# Patient Record
Sex: Female | Born: 1995 | Race: Black or African American | Hispanic: No | Marital: Single | Smoking: Never smoker
Health system: Southern US, Community
[De-identification: ages and names within clinical notes are randomized; demographics above are authoritative.]

## PROBLEM LIST (undated history)

## (undated) ENCOUNTER — Emergency Department (HOSPITAL_COMMUNITY): Discharge status: Against Medical Advice | Payer: Medicaid Other

## (undated) DIAGNOSIS — E669 Obesity, unspecified: Secondary | ICD-10-CM

## (undated) DIAGNOSIS — E282 Polycystic ovarian syndrome: Secondary | ICD-10-CM

## (undated) DIAGNOSIS — F419 Anxiety disorder, unspecified: Secondary | ICD-10-CM

## (undated) HISTORY — PX: TONSILLECTOMY: SUR1361

## (undated) HISTORY — PX: OTHER SURGICAL HISTORY: SHX169

---
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
## 2016-02-17 ENCOUNTER — Emergency Department (HOSPITAL_COMMUNITY)
Admission: EM | Admit: 2016-02-17 | Discharge: 2016-02-17 | Disposition: A | Discharge status: Home / Self Care | Payer: Medicaid Other | Attending: Emergency Medicine | Admitting: Emergency Medicine

## 2016-02-17 ENCOUNTER — Encounter (HOSPITAL_COMMUNITY): Payer: Self-pay

## 2016-02-17 ENCOUNTER — Emergency Department (HOSPITAL_COMMUNITY): Payer: Medicaid Other

## 2016-02-17 DIAGNOSIS — M2141 Flat foot [pes planus] (acquired), right foot: Secondary | ICD-10-CM | POA: Diagnosis not present

## 2016-02-17 DIAGNOSIS — J45909 Unspecified asthma, uncomplicated: Secondary | ICD-10-CM | POA: Diagnosis not present

## 2016-02-17 DIAGNOSIS — Z791 Long term (current) use of non-steroidal anti-inflammatories (NSAID): Secondary | ICD-10-CM | POA: Diagnosis not present

## 2016-02-17 DIAGNOSIS — M25571 Pain in right ankle and joints of right foot: Secondary | ICD-10-CM | POA: Diagnosis not present

## 2016-02-17 DIAGNOSIS — Z79899 Other long term (current) drug therapy: Secondary | ICD-10-CM | POA: Insufficient documentation

## 2016-02-17 MED ORDER — NAPROXEN SODIUM 275 MG PO TABS: 275.0000 mg | ORAL_TABLET | Freq: Two times a day (BID) | ORAL | 1 refills | Status: DC

## 2016-02-17 NOTE — ED Provider Notes (Signed)
[facility]-EMERGENCY DEPT Provider Note   CSN: [id number] Arrival date & time: 02/17/16  0556     History   Chief Complaint Chief Complaint  Patient presents with  . Ankle Pain    HPI Melissa Ramirez is a 20 y.o. female 's emergency Department with chief complaint of right ankle pain. 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 Patient works at [employer] and is on her feet all day long. She is also obese. She states that over the past several weeks. She's had pain in the right ankle after standing on it for long hours. She states that by the end of the day. It is also swollen. She is frequently works back-to-back shifts and states that she is to have time to let her ankle, rest, and some days are difficult for her to even walk on the ankle., Swelling in the calf, heat or redness. She has no known injuries to the ankle.  HPI  Past Medical History:  Diagnosis Date  . Anxiety   . Asthma   . Obesity   . PCOS (polycystic ovarian syndrome)     Patient Active Problem List   Diagnosis Date Noted  . Obesity 02/21/2014  . PCOS (polycystic ovarian syndrome) 02/21/2014  . Asthma 02/21/2014  . Fibrocystic breast changes of both breasts 02/21/2014    Past Surgical History:  Procedure Laterality Date  . FOOT SURGERY  2009  . TONSILLECTOMY    . tubes in ears      OB History    Gravida Para Term Preterm AB Living   0 0 0 0 0 0   SAB TAB Ectopic Multiple Live Births   0 0 0 0         Home Medications    Prior to Admission medications   Medication Sig Start Date End Date Taking? Authorizing Provider  albuterol (PROVENTIL HFA;VENTOLIN HFA) 108 (90 BASE) MCG/ACT inhaler Inhale 1-2 puffs into the lungs every 6 (six) hours as needed for wheezing or shortness of breath. 08/13/14   Tiffany Neva Seat, PA-C  cetirizine (ZYRTEC ALLERGY) 10 MG tablet Take 1 tablet (10 mg total) by mouth daily. 11/19/15   Danelle Berry,  PA-C  ferrous sulfate 325 (65 FE) MG tablet Take 325 mg by mouth daily with breakfast.    Historical Provider, MD  fluticasone (FLONASE) 50 MCG/ACT nasal spray Place 2 sprays into both nostrils daily. 11/20/15   Danelle Berry, PA-C  Guaifenesin 1200 MG TB12 Take 1 tablet (1,200 mg total) by mouth 2 (two) times daily. Patient taking differently: Take 1 tablet by mouth 2 (two) times daily as needed (cough).  08/09/15   Charlestine Night, PA-C  hydrOXYzine (ATARAX/VISTARIL) 25 MG tablet Take 1 tablet (25 mg total) by mouth every 6 (six) hours as needed for anxiety. 11/18/15   Tilden Fossa, MD  ibuprofen (ADVIL,MOTRIN) 600 MG tablet Take 1 tablet (600 mg total) by mouth every 6 (six) hours as needed for headache. 11/17/15   Lyndal Pulley, MD  LORazepam (ATIVAN) 0.5 MG tablet Take 1 tablet (0.5 mg total) by mouth 2 (two) times daily as needed for anxiety (use sparingly, no more than two times a day, for severe episodes of anxiety and/or panic attacks). 11/20/15   Danelle Berry, PA-C  methocarbamol (ROBAXIN) 500 MG tablet Take 1 tablet (500 mg total) by mouth 2 (two) times daily. Patient not taking: Reported on 11/20/2015 07/07/15  Santiago Glad, PA-C    Family History History reviewed. No pertinent family history.  Social History Social History  Substance Use Topics  . Smoking status: Never Smoker  . Smokeless tobacco: Never Used  . Alcohol use No     Allergies   Morphine and related and Oxycodone   Review of Systems Review of Systems Ten systems reviewed and are negative for acute change, except as noted in the HPI.    Physical Exam Updated Vital Signs BP 125/86 (BP Location: Right Arm)   Pulse 79   Temp 98.1 F (36.7 C) (Oral)   Resp 18   Ht 5\' 6"  (1.676 m)   Wt 113.4 kg   LMP 01/20/2016   SpO2 100%   BMI 40.35 kg/m   Physical Exam  Constitutional: She is oriented to person, place, and time. She appears well-developed and well-nourished. No distress.  HENT:  Head:  Normocephalic and atraumatic.  Eyes: Conjunctivae are normal. No scleral icterus.  Neck: Normal range of motion.  Cardiovascular: Normal rate, regular rhythm and normal heart sounds.  Exam reveals no gallop and no friction rub.   No murmur heard. Pulmonary/Chest: Effort normal and breath sounds normal. No respiratory distress.  Abdominal: Soft. Bowel sounds are normal. She exhibits no distension and no mass. There is no tenderness. There is no guarding.  Musculoskeletal:  Bilateral pes planus, Well healed surgical scar on the dorsal aspect of the left ankle. Decreased range of motion on the left. Full strength and range of motion of the right ankle, no tenderness to palpation. No swelling, bilateral DP and PT pulses 2+, feet warm and well perfused with normal sensation  Neurological: She is alert and oriented to person, place, and time.  Skin: Skin is warm and dry. She is not diaphoretic.  Nursing note and vitals reviewed.    ED Treatments / Results  Labs (all labs ordered are listed, but only abnormal results are displayed) Labs Reviewed - No data to display  EKG  EKG Interpretation None       Radiology Dg Ankle Complete Right  Result Date: 02/17/2016 CLINICAL DATA:  Nontraumatic right ankle pain for 2 days. EXAM: RIGHT ANKLE - COMPLETE 3+ VIEW COMPARISON:  None. FINDINGS: Negative for acute fracture or dislocation. The mortise is symmetric. No bone lesion or bony destruction. No arthritic changes. Marked pes planus. IMPRESSION: Pes planus.  No acute findings are evident about the ankle. Electronically Signed   By: Ellery Plunk M.D.   On: 02/17/2016 06:46    Procedures Procedures (including critical care time)  Medications Ordered in ED Medications - No data to display   Initial Impression / Assessment and Plan / ED Course  I have reviewed the triage vital signs and the nursing notes.  Pertinent labs & imaging results that were available during my care of the  patient were reviewed by me and considered in my medical decision making (see chart for details).  Clinical Course    Patient with negative x-ray. It does shows markedly pes planus. I suspect that her ankle pain is secondary to her obesity and pes planus. Patient will get an ASO splint. However, I feel likely orthotics and follow-up with either sports medicine or podiatry will be helpful. Patient given naproxen which have advised her to take prior to shift where she will be standing for long periods of time. I also advised icing before and after work. No calf swelling or pain. She appears safe for discharge at this time. Final  Clinical Impressions(s) / ED Diagnoses   Final diagnoses:  None    New Prescriptions New Prescriptions   No medications on file     Arthor Captainbigail Ciclaly Mulcahey, Cordelia Poche-C 02/17/16 [id number]    April Palumbo, MD 02/20/16 0020

## 2016-02-17 NOTE — Discharge Instructions (Addendum)
Please return to the emergency department if you have any worsening in your swelling including worsening pain, swelling, redness or heat. Please follow up with Dr. Pearletha ForgeHudnall for further evaluation of her joint pain. He may apply ice to the ankle before and after work, which may help with decreasing swelling and pain.

## 2016-02-17 NOTE — ED Triage Notes (Signed)
Pt complains of right ankle pain since Sunday, no injury noted but its swollen and hard to stand for long periods of time.

## 2016-02-20 ENCOUNTER — Encounter (HOSPITAL_COMMUNITY): Payer: Self-pay | Admitting: Emergency Medicine

## 2016-02-20 DIAGNOSIS — Y999 Unspecified external cause status: Secondary | ICD-10-CM | POA: Insufficient documentation

## 2016-02-20 DIAGNOSIS — J45909 Unspecified asthma, uncomplicated: Secondary | ICD-10-CM | POA: Insufficient documentation

## 2016-02-20 DIAGNOSIS — Y929 Unspecified place or not applicable: Secondary | ICD-10-CM | POA: Insufficient documentation

## 2016-02-20 DIAGNOSIS — Z7951 Long term (current) use of inhaled steroids: Secondary | ICD-10-CM | POA: Diagnosis not present

## 2016-02-20 DIAGNOSIS — Y939 Activity, unspecified: Secondary | ICD-10-CM | POA: Insufficient documentation

## 2016-02-20 DIAGNOSIS — S99911A Unspecified injury of right ankle, initial encounter: Secondary | ICD-10-CM | POA: Diagnosis present

## 2016-02-20 DIAGNOSIS — X58XXXA Exposure to other specified factors, initial encounter: Secondary | ICD-10-CM | POA: Diagnosis not present

## 2016-02-20 DIAGNOSIS — Z79899 Other long term (current) drug therapy: Secondary | ICD-10-CM | POA: Insufficient documentation

## 2016-02-20 DIAGNOSIS — S93401A Sprain of unspecified ligament of right ankle, initial encounter: Secondary | ICD-10-CM | POA: Diagnosis not present

## 2016-02-20 NOTE — ED Triage Notes (Signed)
Pt was seen on 9/12 for an ankle injury.  States no fracture was diagnosed and she was placed in an ASO.  ASO in place on presentation today.  States that she feels that she has re injured the ankle.  Pain in the ankle and "tingling" in the toes.  Pain worsened after being on her feet at work.

## 2016-02-21 ENCOUNTER — Emergency Department (HOSPITAL_COMMUNITY)
Admission: EM | Admit: 2016-02-21 | Discharge: 2016-02-21 | Disposition: A | Discharge status: Home / Self Care | Payer: Medicaid Other | Attending: Emergency Medicine | Admitting: Emergency Medicine

## 2016-02-21 DIAGNOSIS — S93401A Sprain of unspecified ligament of right ankle, initial encounter: Secondary | ICD-10-CM

## 2016-02-21 MED ORDER — NAPROXEN SODIUM 275 MG PO TABS: 275.0000 mg | ORAL_TABLET | Freq: Two times a day (BID) | ORAL | 0 refills | Status: DC

## 2016-02-21 NOTE — ED Provider Notes (Signed)
[facility] Provider Note   CSN: [id number] Arrival date & time: 02/20/16  2246     History   Chief Complaint Chief Complaint  Patient presents with  . Foot Pain    HPI Melissa Ramirez is a 20 y.o. female.  20 year old female with no significant past medical history presents to the emergency department for evaluation of persistent right ankle pain. Patient was seen on 02/17/2016 for similar symptoms. She states that she has continued to have discomfort in her ankle, primarily after standing for long hours at her job. She had a negative x-ray at initial ED visit. She reports that she may have "stepped wrong". She had a fleeting episode of paresthesias in her toes which has resolved. Patient has continued wearing an ankle brace and icing her ankle. She followed up with her primary care doctor 2 days ago who is in the process of referring her to orthopedics.   The history is provided by the patient. No language interpreter was used.  Foot Pain     Past Medical History:  Diagnosis Date  . Anxiety   . Asthma   . Obesity   . PCOS (polycystic ovarian syndrome)     Patient Active Problem List   Diagnosis Date Noted  . Obesity 02/21/2014  . PCOS (polycystic ovarian syndrome) 02/21/2014  . Asthma 02/21/2014  . Fibrocystic breast changes of both breasts 02/21/2014    Past Surgical History:  Procedure Laterality Date  . FOOT SURGERY  2009  . TONSILLECTOMY    . tubes in ears      OB History    Gravida Para Term Preterm AB Living   0 0 0 0 0 0   SAB TAB Ectopic Multiple Live Births   0 0 0 0         Home Medications    Prior to Admission medications   Medication Sig Start Date End Date Taking? Authorizing Provider  albuterol (PROVENTIL HFA;VENTOLIN HFA) 108 (90 BASE) MCG/ACT inhaler Inhale 1-2 puffs into the lungs every 6 (six) hours as needed for wheezing or shortness of breath. 08/13/14   Tiffany Neva Seat, PA-C  cetirizine (ZYRTEC ALLERGY) 10 MG tablet Take 1  tablet (10 mg total) by mouth daily. 11/19/15   Danelle Berry, PA-C  ferrous sulfate 325 (65 FE) MG tablet Take 325 mg by mouth daily with breakfast.    Historical Provider, MD  fluticasone (FLONASE) 50 MCG/ACT nasal spray Place 2 sprays into both nostrils daily. 11/20/15   Danelle Berry, PA-C  Guaifenesin 1200 MG TB12 Take 1 tablet (1,200 mg total) by mouth 2 (two) times daily. Patient taking differently: Take 1 tablet by mouth 2 (two) times daily as needed (cough).  08/09/15   Charlestine Night, PA-C  hydrOXYzine (ATARAX/VISTARIL) 25 MG tablet Take 1 tablet (25 mg total) by mouth every 6 (six) hours as needed for anxiety. 11/18/15   Tilden Fossa, MD  ibuprofen (ADVIL,MOTRIN) 600 MG tablet Take 1 tablet (600 mg total) by mouth every 6 (six) hours as needed for headache. 11/17/15   Lyndal Pulley, MD  LORazepam (ATIVAN) 0.5 MG tablet Take 1 tablet (0.5 mg total) by mouth 2 (two) times daily as needed for anxiety (use sparingly, no more than two times a day, for severe episodes of anxiety and/or panic attacks). 11/20/15   Danelle Berry, PA-C  methocarbamol (ROBAXIN) 500 MG tablet Take 1 tablet (500 mg total) by mouth 2 (two) times daily. Patient not taking: Reported on 11/20/2015 07/07/15   Santiago Glad,  PA-C  naproxen sodium (ANAPROX) 275 MG tablet Take 1 tablet (275 mg total) by mouth 2 (two) times daily with a meal. As needed for ankle pain. 02/21/16   Antony Madura, PA-C    Family History No family history on file.  Social History Social History  Substance Use Topics  . Smoking status: Never Smoker  . Smokeless tobacco: Never Used  . Alcohol use No     Allergies   Morphine and related and Oxycodone   Review of Systems Review of Systems  Musculoskeletal: Positive for arthralgias and joint swelling.  Ten systems reviewed and are negative for acute change, except as noted in the HPI.     Physical Exam Updated Vital Signs BP 112/76 (BP Location: Left Arm)   Pulse 80   Temp 99.9 F (37.7 C)    Resp 19   LMP 01/20/2016   SpO2 100%   Physical Exam  Constitutional: She is oriented to person, place, and time. She appears well-developed and well-nourished. No distress.  Nontoxic appearing  HENT:  Head: Normocephalic and atraumatic.  Eyes: Conjunctivae and EOM are normal. No scleral icterus.  Neck: Normal range of motion.  Cardiovascular: Normal rate, regular rhythm and intact distal pulses.   DP and PT pulses 2+ in the right lower extremity.  Pulmonary/Chest: Effort normal. No respiratory distress.  Respirations even and unlabored  Musculoskeletal: Normal range of motion.  Mild soft tissue swelling to the right lateral ankle without effusion. No bony deformity or crepitus. Negative Thompson's test. No erythema or heat to touch.  Neurological: She is alert and oriented to person, place, and time.  Sensation to light touch intact in the right lower extremity. Patient able to wiggle all toes.  Skin: Skin is warm and dry. No rash noted. She is not diaphoretic. No erythema. No pallor.  Psychiatric: She has a normal mood and affect. Her behavior is normal.  Nursing note and vitals reviewed.    ED Treatments / Results  Labs (all labs ordered are listed, but only abnormal results are displayed) Labs Reviewed - No data to display  EKG  EKG Interpretation None       Radiology Dg Ankle Complete Right  Result Date: 02/17/2016 CLINICAL DATA:  Nontraumatic right ankle pain for 2 days. EXAM: RIGHT ANKLE - COMPLETE 3+ VIEW COMPARISON:  None. FINDINGS: Negative for acute fracture or dislocation. The mortise is symmetric. No bone lesion or bony destruction. No arthritic changes. Marked pes planus. IMPRESSION: Pes planus.  No acute findings are evident about the ankle. Electronically Signed   By: Ellery Plunk M.D.   On: 02/17/2016 06:46    Procedures Procedures (including critical care time)  Medications Ordered in ED Medications - No data to display   Initial Impression /  Assessment and Plan / ED Course  I have reviewed the triage vital signs and the nursing notes.  Pertinent labs & imaging results that were available during my care of the patient were reviewed by me and considered in my medical decision making (see chart for details).  Clinical Course    20 year old female presents to the emergency department for evaluation of persistent right ankle pain. Symptoms consistent with ankle sprain. No concern for septic joint. Will continue to manage supportively. Patient has been advised to consistently use NSAIDs. She is in the process of being referred to orthopedics by her primary care doctor. Return precautions discussed and provided. Patient discharged in satisfactory condition with no unaddressed concerns.   Final Clinical Impressions(s) /  ED Diagnoses   Final diagnoses:  Ankle sprain, right, initial encounter    New Prescriptions Discharge Medication List as of 02/21/2016  3:41 AM       Antony MaduraKelly Darlen Gledhill, PA-C 02/21/16 0404    Dione Boozeavid Glick, MD 02/21/16 [phone]

## 2016-04-02 ENCOUNTER — Emergency Department (HOSPITAL_COMMUNITY)
Admission: EM | Admit: 2016-04-02 | Discharge: 2016-04-03 | Disposition: A | Discharge status: Home / Self Care | Payer: Medicaid Other | Attending: Emergency Medicine | Admitting: Emergency Medicine

## 2016-04-02 ENCOUNTER — Encounter (HOSPITAL_COMMUNITY): Payer: Self-pay | Admitting: Emergency Medicine

## 2016-04-02 DIAGNOSIS — R110 Nausea: Secondary | ICD-10-CM | POA: Insufficient documentation

## 2016-04-02 DIAGNOSIS — R51 Headache: Secondary | ICD-10-CM | POA: Diagnosis present

## 2016-04-02 DIAGNOSIS — R200 Anesthesia of skin: Secondary | ICD-10-CM | POA: Insufficient documentation

## 2016-04-02 DIAGNOSIS — J309 Allergic rhinitis, unspecified: Secondary | ICD-10-CM | POA: Diagnosis not present

## 2016-04-02 DIAGNOSIS — Z79899 Other long term (current) drug therapy: Secondary | ICD-10-CM | POA: Insufficient documentation

## 2016-04-02 DIAGNOSIS — R519 Headache, unspecified: Secondary | ICD-10-CM

## 2016-04-02 DIAGNOSIS — R197 Diarrhea, unspecified: Secondary | ICD-10-CM | POA: Insufficient documentation

## 2016-04-02 MED ORDER — DIPHENHYDRAMINE HCL 50 MG/ML IJ SOLN
25.0000 mg | Freq: Once | INTRAMUSCULAR | Status: AC
  Administered 2016-04-02: 25 mg via INTRAVENOUS
  Filled 2016-04-02: qty 1

## 2016-04-02 MED ORDER — SODIUM CHLORIDE 0.9 % IV BOLUS (SEPSIS)
1000.0000 mL | Freq: Once | INTRAVENOUS | Status: AC
  Administered 2016-04-02: 1000 mL via INTRAVENOUS

## 2016-04-02 MED ORDER — ACETAMINOPHEN 325 MG PO TABS
650.0000 mg | ORAL_TABLET | Freq: Once | ORAL | Status: AC
  Administered 2016-04-02: 650 mg via ORAL
  Filled 2016-04-02: qty 2

## 2016-04-02 MED ORDER — METOCLOPRAMIDE HCL 5 MG/ML IJ SOLN
10.0000 mg | Freq: Once | INTRAMUSCULAR | Status: AC
  Administered 2016-04-02: 10 mg via INTRAVENOUS
  Filled 2016-04-02: qty 2

## 2016-04-02 NOTE — ED Provider Notes (Signed)
[facility]ER[facility] DEPT Provider Note   CSN: [id number] Arrival date & time: 04/02/16  1631  By signing my name below, I, Octavia Heirrianna Nassar, attest that this documentation has been prepared under the direction and in the presence of Everlene FarrierWilliam Weda Baumgarner, PA-C.  Electronically Signed: Octavia HeirArianna Nassar, ED Scribe. 04/02/16. 10:03 PM.    History   Chief Complaint Chief Complaint  Patient presents with  . Headache    The history is provided by the patient. No language interpreter was used.   HPI Comments: Melissa Ramirez is a 20 y.o. female who has a PMhx of asthma, allergies, PCOS, and anxiety presents to the Emergency Department complaining of a constant, gradual worsening, frontal headache x 4 days. She rates her headache as a 6/10. Associated nausea, diarrhea, left ear fullness, and tingling to the bilateral sides of her face. She notes having a hx of headaches in the past but states none have lasted this long. Pt has taken Aleve to alleviate her headache without relief. She has been evaluated by her PCP for her symptoms but has not had any diagnosis. Denies sinus congestion, post-nasal drip, neck pain, neck stiffness, fever, shortness of breath, vomiting, abdominal pain, double vision, trouble swallowing, ear pain, ear discharge, dysuria, hematuria, abnormal vaginal discharge, numbness, weakness or tingling to upper or lower extremities. She is currently on her menstrual cycle.  Past Medical History:  Diagnosis Date  . Anxiety   . Asthma   . Obesity   . PCOS (polycystic ovarian syndrome)     Patient Active Problem List   Diagnosis Date Noted  . Obesity 02/21/2014  . PCOS (polycystic ovarian syndrome) 02/21/2014  . Asthma 02/21/2014  . Fibrocystic breast changes of both breasts 02/21/2014    Past Surgical History:  Procedure Laterality Date  . FOOT SURGERY  2009  . TONSILLECTOMY    . tubes in ears      OB History    Gravida Para Term Preterm AB Living   0 0 0 0 0 0   SAB TAB Ectopic  Multiple Live Births   0 0 0 0         Home Medications    Prior to Admission medications   Medication Sig Start Date End Date Taking? Authorizing Provider  albuterol (PROVENTIL HFA;VENTOLIN HFA) 108 (90 BASE) MCG/ACT inhaler Inhale 1-2 puffs into the lungs every 6 (six) hours as needed for wheezing or shortness of breath. 08/13/14  Yes Tiffany Neva SeatGreene, PA-C  hydrOXYzine (ATARAX/VISTARIL) 25 MG tablet Take 1 tablet (25 mg total) by mouth every 6 (six) hours as needed for anxiety. 11/18/15  Yes Tilden FossaElizabeth Rees, MD  LORazepam (ATIVAN) 0.5 MG tablet Take 1 tablet (0.5 mg total) by mouth 2 (two) times daily as needed for anxiety (use sparingly, no more than two times a day, for severe episodes of anxiety and/or panic attacks). 11/20/15  Yes Danelle BerryLeisa Tapia, PA-C  acetaminophen (TYLENOL) 500 MG tablet Take 1 tablet (500 mg total) by mouth every 6 (six) hours as needed. 04/03/16   Everlene FarrierWilliam Roseline Ebarb, PA-C  cetirizine (ZYRTEC ALLERGY) 10 MG tablet Take 1 tablet (10 mg total) by mouth daily. 04/03/16   Everlene FarrierWilliam Stephany Poorman, PA-C  fluticasone (FLONASE) 50 MCG/ACT nasal spray Place 1 spray into both nostrils daily. 04/03/16   Everlene FarrierWilliam Jesper Stirewalt, PA-C  methocarbamol (ROBAXIN) 500 MG tablet Take 1 tablet (500 mg total) by mouth 2 (two) times daily. Patient not taking: Reported on 11/20/2015 07/07/15   Santiago GladHeather Laisure, PA-C    Family History History reviewed. No  pertinent family history.  Social History Social History  Substance Use Topics  . Smoking status: Never Smoker  . Smokeless tobacco: Never Used  . Alcohol use No     Allergies   Morphine and related and Oxycodone   Review of Systems Review of Systems  Constitutional: Negative for fever.  HENT: Positive for ear pain (left ear fullness). Negative for postnasal drip, sinus pressure and trouble swallowing.   Eyes: Negative for visual disturbance.  Respiratory: Negative for cough and shortness of breath.   Cardiovascular: Negative for chest pain.    Gastrointestinal: Positive for diarrhea and nausea. Negative for abdominal pain and vomiting.  Genitourinary: Negative for dysuria, hematuria and vaginal discharge.  Musculoskeletal: Negative for neck pain and neck stiffness.  Neurological: Positive for numbness and headaches. Negative for dizziness, syncope, weakness and light-headedness.     Physical Exam Updated Vital Signs BP 123/73 (BP Location: Right Arm)   Pulse 65   Temp 98.5 F (36.9 C) (Oral)   Resp 20   Ht 5\' 6"  (1.676 m)   Wt 112.9 kg   SpO2 100%   BMI 40.19 kg/m   Physical Exam  Constitutional: She is oriented to person, place, and time. She appears well-developed and well-nourished. No distress.  HENT:  Head: Normocephalic and atraumatic.  Right Ear: External ear normal. Tympanic membrane is not erythematous. A middle ear effusion is present.  Left Ear: External ear normal. Tympanic membrane is not erythematous. A middle ear effusion is present.  Mouth/Throat: Oropharynx is clear and moist.  Mild bilateral middle ear effusion  No TM erythema or loss of landmarks No temporal edema or tenderness Throat is clear  Eyes: Conjunctivae and EOM are normal. Pupils are equal, round, and reactive to light. Right eye exhibits no discharge. Left eye exhibits no discharge.  Neck: Normal range of motion. Neck supple. No JVD present. No tracheal deviation present.  Cardiovascular: Normal rate, regular rhythm, normal heart sounds and intact distal pulses.  Exam reveals no gallop and no friction rub.   No murmur heard. Pulmonary/Chest: Effort normal and breath sounds normal. No stridor. No respiratory distress. She has no wheezes. She has no rales.  Lungs clear to ausculation  Abdominal: Soft. There is no tenderness.  Musculoskeletal: Normal range of motion. She exhibits no edema or tenderness.  Lymphadenopathy:    She has no cervical adenopathy.  Neurological: She is alert and oriented to person, place, and time. No cranial  nerve deficit. Coordination normal.  Alerted and oriented x 3, cranial nerves intact, speech is clear and coherent, no pronator drift, finger to nose intact, sensation intact to bilateral upper and lower extremities and face, normal gait.  Skin: Skin is warm and dry. No rash noted. She is not diaphoretic. No erythema. No pallor.  Psychiatric: She has a normal mood and affect. Her behavior is normal.  Nursing note and vitals reviewed.    ED Treatments / Results  DIAGNOSTIC STUDIES: Oxygen Saturation is 98% on RA, normal by my interpretation.  COORDINATION OF CARE:  10:01 PM Will order migraine cocktail and pt will receive IV fluids. Discussed treatment plan with pt at bedside and pt agreed to plan.  Labs (all labs ordered are listed, but only abnormal results are displayed) Labs Reviewed - No data to display  EKG  EKG Interpretation None       Radiology No results found.  Procedures Procedures (including critical care time)  Medications Ordered in ED Medications  sodium chloride 0.9 % bolus 1,000  mL (1,000 mLs Intravenous New Bag/Given 04/02/16 2356)  metoCLOPramide (REGLAN) injection 10 mg (10 mg Intravenous Given 04/02/16 2343)  diphenhydrAMINE (BENADRYL) injection 25 mg (25 mg Intravenous Given 04/02/16 2357)  acetaminophen (TYLENOL) tablet 650 mg (650 mg Oral Given 04/02/16 2342)     Initial Impression / Assessment and Plan / ED Course  I have reviewed the triage vital signs and the nursing notes.  Pertinent labs & imaging results that were available during my care of the patient were reviewed by me and considered in my medical decision making (see chart for details).  Clinical Course   Pt HA treated and resolved while in ED.  Presentation is like pts typical HA and non concerning for [facility], ICH, Meningitis, or temporal arteritis. Pt is afebrile with no focal neuro deficits, nuchal rigidity, or change in vision. Pt is to follow up with PCP to discuss prophylactic  medication. I advised the patient to follow-up with their primary care provider this week. I advised the patient to return to the emergency department with new or worsening symptoms or new concerns. The patient verbalized understanding and agreement with plan.     Final Clinical Impressions(s) / ED Diagnoses   Final diagnoses:  Bad headache  Allergic rhinitis, unspecified chronicity, unspecified seasonality, unspecified trigger   I personally performed the services described in this documentation, which was scribed in my presence. The recorded information has been reviewed and is accurate.    New Prescriptions Current Discharge Medication List    START taking these medications   Details  acetaminophen (TYLENOL) 500 MG tablet Take 1 tablet (500 mg total) by mouth every 6 (six) hours as needed. Qty: 30 tablet, Refills: 0         Everlene Farrier, PA-C 04/03/16 0140    Rolland Porter, MD 04/12/16 2045

## 2016-04-02 NOTE — ED Triage Notes (Signed)
Patient reports headache x 3 days.  Reports nausea.  Denies vomiting, photophobia, dizziness.

## 2016-04-03 MED ORDER — FLUTICASONE PROPIONATE 50 MCG/ACT NA SUSP: 1.0000 | Freq: Every day | NASAL | 0 refills | Status: DC

## 2016-04-03 MED ORDER — CETIRIZINE HCL 10 MG PO TABS: 10.0000 mg | ORAL_TABLET | Freq: Every day | ORAL | 1 refills | Status: DC

## 2016-04-03 MED ORDER — ACETAMINOPHEN 500 MG PO TABS: 500.0000 mg | ORAL_TABLET | Freq: Four times a day (QID) | ORAL | 0 refills | Status: DC | PRN

## 2016-09-29 ENCOUNTER — Emergency Department (HOSPITAL_COMMUNITY): Payer: No Typology Code available for payment source

## 2016-09-29 ENCOUNTER — Emergency Department (HOSPITAL_COMMUNITY)
Admission: EM | Admit: 2016-09-29 | Discharge: 2016-09-29 | Disposition: A | Discharge status: Home / Self Care | Payer: No Typology Code available for payment source | Attending: Physician Assistant | Admitting: Physician Assistant

## 2016-09-29 ENCOUNTER — Encounter (HOSPITAL_COMMUNITY): Payer: Self-pay | Admitting: Emergency Medicine

## 2016-09-29 DIAGNOSIS — S93491A Sprain of other ligament of right ankle, initial encounter: Secondary | ICD-10-CM

## 2016-09-29 DIAGNOSIS — X509XXA Other and unspecified overexertion or strenuous movements or postures, initial encounter: Secondary | ICD-10-CM | POA: Insufficient documentation

## 2016-09-29 DIAGNOSIS — Y9301 Activity, walking, marching and hiking: Secondary | ICD-10-CM | POA: Insufficient documentation

## 2016-09-29 DIAGNOSIS — S93432A Sprain of tibiofibular ligament of left ankle, initial encounter: Secondary | ICD-10-CM | POA: Insufficient documentation

## 2016-09-29 DIAGNOSIS — Y9289 Other specified places as the place of occurrence of the external cause: Secondary | ICD-10-CM | POA: Insufficient documentation

## 2016-09-29 DIAGNOSIS — Y990 Civilian activity done for income or pay: Secondary | ICD-10-CM | POA: Insufficient documentation

## 2016-09-29 DIAGNOSIS — Z79899 Other long term (current) drug therapy: Secondary | ICD-10-CM | POA: Insufficient documentation

## 2016-09-29 DIAGNOSIS — J45909 Unspecified asthma, uncomplicated: Secondary | ICD-10-CM | POA: Insufficient documentation

## 2016-09-29 NOTE — ED Provider Notes (Signed)
[facility]-EMERGENCY DEPT Provider Note   CSN: [id number] Arrival date & time: 09/29/16  1807   By signing my name below, I, Clarisse Gouge, attest that this documentation has been prepared under the direction and in the presence of [facility], PA-C. Electronically signed, Clarisse Gouge, ED Scribe. 09/29/16. 7:51 PM.   History   Chief Complaint Chief Complaint  Patient presents with  . Ankle Pain   The history is provided by the patient, medical records and a parent. No language interpreter was used.    Melissa Ramirez is a 21 y.o. female who presents to the Emergency Department with concern for worsened lateral ankle pain s/p rolling it yesterday. Pt states she twisted her ankle walking to the restroom. Associated R ankle swelling, R ankle instability sensation and walking with a limp. She describes 7/10, constant, throbbing R ankle pain that is worse with standing and walking. Swelling improved with ice, but pain no better. No other modifying factors noted. No numbness or tingling. No other complaints at this time.     Past Medical History:  Diagnosis Date  . Anxiety   . Asthma   . Obesity   . PCOS (polycystic ovarian syndrome)     Patient Active Problem List   Diagnosis Date Noted  . Obesity 02/21/2014  . PCOS (polycystic ovarian syndrome) 02/21/2014  . Asthma 02/21/2014  . Fibrocystic breast changes of both breasts 02/21/2014    Past Surgical History:  Procedure Laterality Date  . FOOT SURGERY  2009  . TONSILLECTOMY    . tubes in ears      OB History    Gravida Para Term Preterm AB Living   0 0 0 0 0 0   SAB TAB Ectopic Multiple Live Births   0 0 0 0         Home Medications    Prior to Admission medications   Medication Sig Start Date End Date Taking? Authorizing Provider  acetaminophen (TYLENOL) 500 MG tablet Take 1 tablet (500 mg total) by mouth every 6 (six) hours as needed. 04/03/16   Everlene Farrier, PA-C  albuterol (PROVENTIL HFA;VENTOLIN HFA) 108 (90 BASE)  MCG/ACT inhaler Inhale 1-2 puffs into the lungs every 6 (six) hours as needed for wheezing or shortness of breath. 08/13/14   Tiffany Neva Seat, PA-C  cetirizine (ZYRTEC ALLERGY) 10 MG tablet Take 1 tablet (10 mg total) by mouth daily. 04/03/16   Everlene Farrier, PA-C  fluticasone (FLONASE) 50 MCG/ACT nasal spray Place 1 spray into both nostrils daily. 04/03/16   Everlene Farrier, PA-C  hydrOXYzine (ATARAX/VISTARIL) 25 MG tablet Take 1 tablet (25 mg total) by mouth every 6 (six) hours as needed for anxiety. 11/18/15   Tilden Fossa, MD  LORazepam (ATIVAN) 0.5 MG tablet Take 1 tablet (0.5 mg total) by mouth 2 (two) times daily as needed for anxiety (use sparingly, no more than two times a day, for severe episodes of anxiety and/or panic attacks). 11/20/15   Danelle Berry, PA-C  methocarbamol (ROBAXIN) 500 MG tablet Take 1 tablet (500 mg total) by mouth 2 (two) times daily. Patient not taking: Reported on 11/20/2015 07/07/15   Santiago Glad, PA-C    Family History No family history on file.  Social History Social History  Substance Use Topics  . Smoking status: Never Smoker  . Smokeless tobacco: Never Used  . Alcohol use No     Allergies   Morphine and related and Oxycodone   Review of Systems Review of Systems  Musculoskeletal: Positive for  arthralgias, gait problem and joint swelling.  Skin: Negative for wound.  Neurological: Negative for numbness.     Physical Exam Updated Vital Signs BP (!) 155/101 (BP Location: Left Wrist)   Pulse 90   Temp 98.3 F (36.8 C) (Oral)   Resp 17   Ht  (1.676 m)   Wt 278 lb (126.1 kg)   LMP 09/30/2015   SpO2 100%   BMI 44.87 kg/m   Physical Exam  Constitutional: She is oriented to person, place, and time. She appears well-developed and well-nourished. No distress.  HENT:  Head: Normocephalic and atraumatic.  Cardiovascular: Normal rate, regular rhythm and normal heart sounds.   No murmur heard. Pulmonary/Chest: Effort normal and breath  sounds normal. No respiratory distress.  Abdominal: Soft. She exhibits no distension. There is no tenderness.  Musculoskeletal: Normal range of motion. She exhibits tenderness.  TTP along R ATFL with swelling noted, FROM of the ankle, achillles intact, sensation intact, 2+ D/P  Neurological: She is alert and oriented to person, place, and time.  Skin: Skin is warm and dry.  Nursing note and vitals reviewed.    ED Treatments / Results  DIAGNOSTIC STUDIES: Oxygen Saturation is 100% on RA, NL by my interpretation.    COORDINATION OF CARE: 7:46 PM-Discussed next steps with pt. Pt verbalized understanding and is agreeable with the plan. Will order brace and crutches. Pt prepared for d/c, advised of symptomatic care at home and return precautions.    Labs (all labs ordered are listed, but only abnormal results are displayed) Labs Reviewed - No data to display  EKG  EKG Interpretation None       Radiology Dg Ankle Complete Right  Result Date: 09/29/2016 CLINICAL DATA:  Right ankle pain and swelling. EXAM: RIGHT ANKLE - COMPLETE 3+ VIEW COMPARISON:  02/17/2016 FINDINGS: No acute bony abnormality. Specifically, no fracture, subluxation, or dislocation. Soft tissues are intact. IMPRESSION: No acute bony abnormality. Electronically Signed   By: Charlett Nose M.D.   On: 09/29/2016 19:13    Procedures Procedures (including critical care time)  Medications Ordered in ED Medications - No data to display   Initial Impression / Assessment and Plan / ED Course  I have reviewed the triage vital signs and the nursing notes.  Pertinent labs & imaging results that were available during my care of the patient were reviewed by me and considered in my medical decision making (see chart for details).    Melissa Ramirez is a 21 y.o. female who presents to ED for acute onset of right ankle pain after inversion injury yesterday. RLE NVI and x-ray negative for acute injury. Crutches and ASO brace  provided in ED. Symptomatic home care instructions discussed with patient and mother at bedside. PCP follow up in 1 week if symptoms not improving. All questions answered.    Final Clinical Impressions(s) / ED Diagnoses   Final diagnoses:  Sprain of anterior talofibular ligament of right ankle, initial encounter    New Prescriptions New Prescriptions   No medications on file   I personally performed the services described in this documentation, which was scribed in my presence. The recorded information has been reviewed and is accurate.    [facility] Lucile Didonato, PA-C 09/29/16 2052    Courteney Randall An, MD 09/30/16 [phone]

## 2016-09-29 NOTE — ED Triage Notes (Signed)
Patient c/o right ankle pain and swelling onset of yesterday after work. Pt states is it common for her ankles to swell at the end of the day from work but it is unusual for this much pain. Pt iced at home with no relief.

## 2016-09-29 NOTE — ED Notes (Signed)
Patient is resting comfortably. 

## 2016-09-29 NOTE — Discharge Instructions (Signed)
Aleve as needed for pain. You can take two twice a day. Ice and alleviate for additional pain relief. If your symptoms persist without improvement in one week, please follow up with your primary care doctor.    TREATMENT  Rest, ice, elevation, and compression are the basic modes of treatment.    HOME CARE INSTRUCTIONS  Apply ice to the sore area for 15 to 20 minutes, 3 to 4 times per day. Do this while you are awake for the first 2 days. This can be stopped when the swelling goes away. Put the ice in a plastic bag and place a towel between the bag of ice and your skin.  Keep your leg elevated when possible to lessen swelling.  You may take off your ankle stabilizer at night and to take a shower or bath. Wiggle your toes several times per day if you are able.  ACTIVITY:            - Weight bearing as tolerated - if you can do it, do it. If it hurts, stop.             - Exercises should be limited to pain free range of motion            - Can start mobilization by tracing the alphabet with your foot in the air.       SEEK MEDICAL CARE IF:  You have an increase in bruising, swelling, or pain.  Your toes feel cold.  Pain relief is not achieved with medications.  EMERGENCY:: Your toes are numb or blue or you have severe pain.   MAKE SURE YOU:  Understand these instructions.  Will watch your condition.  Will get help right away if you are not doing well or get worse

## 2016-11-22 ENCOUNTER — Encounter (HOSPITAL_COMMUNITY): Payer: Self-pay | Admitting: Emergency Medicine

## 2016-11-22 ENCOUNTER — Emergency Department (HOSPITAL_COMMUNITY)
Admission: EM | Admit: 2016-11-22 | Discharge: 2016-11-22 | Disposition: A | Discharge status: Home / Self Care | Payer: No Typology Code available for payment source | Attending: Emergency Medicine | Admitting: Emergency Medicine

## 2016-11-22 DIAGNOSIS — Z79899 Other long term (current) drug therapy: Secondary | ICD-10-CM | POA: Insufficient documentation

## 2016-11-22 DIAGNOSIS — R1010 Upper abdominal pain, unspecified: Secondary | ICD-10-CM | POA: Insufficient documentation

## 2016-11-22 DIAGNOSIS — J45909 Unspecified asthma, uncomplicated: Secondary | ICD-10-CM | POA: Insufficient documentation

## 2016-11-22 DIAGNOSIS — R197 Diarrhea, unspecified: Secondary | ICD-10-CM

## 2016-11-22 LAB — COMPREHENSIVE METABOLIC PANEL
ALT: 18 U/L (ref 14–54)
AST: 16 U/L (ref 15–41)
Albumin: 4.3 g/dL (ref 3.5–5.0)
Alkaline Phosphatase: 80 U/L (ref 38–126)
Anion gap: 5 (ref 5–15)
BUN: 10 mg/dL (ref 6–20)
CHLORIDE: 107 mmol/L (ref 101–111)
CO2: 30 mmol/L (ref 22–32)
CREATININE: 0.83 mg/dL (ref 0.44–1.00)
Calcium: 9.8 mg/dL (ref 8.9–10.3)
GFR calc non Af Amer: >60 mL/min (ref >60)
Glucose, Bld: 83 mg/dL (ref 65–99)
Potassium: 3.6 mmol/L (ref 3.5–5.1)
SODIUM: 142 mmol/L (ref 135–145)
Total Bilirubin: 0.5 mg/dL (ref 0.3–1.2)
Total Protein: 8.2 g/dL — ABNORMAL HIGH (ref 6.5–8.1)

## 2016-11-22 LAB — MAGNESIUM: Magnesium: 1.9 mg/dL (ref 1.7–2.4)

## 2016-11-22 LAB — CBC
HCT: 38.7 % (ref 36.0–46.0)
Hemoglobin: 13 g/dL (ref 12.0–15.0)
MCH: 24.8 pg — AB (ref 26.0–34.0)
MCHC: 33.6 g/dL (ref 30.0–36.0)
MCV: 73.7 fL — AB (ref 78.0–100.0)
Platelets: 304 10*3/uL (ref 150–400)
RBC: 5.25 MIL/uL — AB (ref 3.87–5.11)
RDW: 14.8 % (ref 11.5–15.5)
WBC: 11 10*3/uL — ABNORMAL HIGH (ref 4.0–10.5)

## 2016-11-22 LAB — URINALYSIS, ROUTINE W REFLEX MICROSCOPIC
BILIRUBIN URINE: NEGATIVE
GLUCOSE, UA: NEGATIVE mg/dL
Hgb urine dipstick: NEGATIVE
KETONES UR: NEGATIVE mg/dL
Leukocytes, UA: NEGATIVE
Nitrite: NEGATIVE
PH: 6.5 (ref 5.0–8.0)
Protein, ur: NEGATIVE mg/dL
Specific Gravity, Urine: 1.025 (ref 1.005–1.030)

## 2016-11-22 LAB — POC URINE PREG, ED: Preg Test, Ur: NEGATIVE

## 2016-11-22 NOTE — Discharge Instructions (Signed)
You might have mild diarrhea. Most diarrheas are self limiting. If you get bloody stools or high fevers - come back to the ER. If you get severely dehydrated - come back to the ER. Otherwise, see your doctor in 2 weeks if the symptoms continue.

## 2016-11-22 NOTE — ED Triage Notes (Signed)
Pt states she has had abd pain and diarrhea for the past 2 weeks  Pt states she has used OTC medication like pepto and anti diarrhea meds without improvement  Denies vomiting  Pt states has been drinking gatorade and other liquids to stay hydrated

## 2016-11-22 NOTE — ED Provider Notes (Signed)
[facility]-EMERGENCY DEPT Provider Note   CSN: [id number] Arrival date & time: 11/22/16  0001  By signing my name below, I, Ny'Kea Lewis, attest that this documentation has been prepared under the direction and in the presence of Derwood Kaplan, MD. Electronically Signed: Karren Cobble, ED Scribe. 11/22/16. 2:13 AM  History   Chief Complaint Chief Complaint  Patient presents with  . Diarrhea  . Abdominal Pain   The history is provided by the patient and a parent. No language interpreter was used.    HPI Comments: Melissa Ramirez is a 21 y.o. female with a PMHx of anxiety, asthma, obesity, and PCOS who presents to the Emergency Department complaining of  Intermittent  upper quadrant abdominal pain and diarrhea that began eight days ago. She has no associated nausea or emesis.  Pt reports after eating cheese sticks eight days ago, she  experienced her first episode of watery diarrhea accompanied by upper quadrant abdominal pain. She reports having three episodes of diarrhea a day. Initally her diarrhea was brown and at this time she notes it is "goldish". Pt states after consuming food she has more frequent episodes of diarrhea.  She reports normal food and fluid intake. Notes family history of IBS. She has tried Pepto-Bismol and Imodium with mild relief but reports her symptoms always return. Denies hematochezia, fever, emesis, lightheadedness, or dizziness.  Pt denies any recent travels. She denies any recent antibiotics.  Past Medical History:  Diagnosis Date  . Anxiety   . Asthma   . Obesity   . PCOS (polycystic ovarian syndrome)     Patient Active Problem List   Diagnosis Date Noted  . Obesity 02/21/2014  . PCOS (polycystic ovarian syndrome) 02/21/2014  . Asthma 02/21/2014  . Fibrocystic breast changes of both breasts 02/21/2014    Past Surgical History:  Procedure Laterality Date  . FOOT SURGERY  2009  . TONSILLECTOMY    . tubes in ears      OB History    Gravida Para Term  Preterm AB Living   0 0 0 0 0 0   SAB TAB Ectopic Multiple Live Births   0 0 0 0         Home Medications    Prior to Admission medications   Medication Sig Start Date End Date Taking? Authorizing Provider  bismuth subsalicylate (PEPTO BISMOL) 262 MG/15ML suspension Take 30 mLs by mouth every 6 (six) hours as needed for diarrhea or loose stools.   Yes [provider]  loperamide (IMODIUM A-D) 2 MG tablet Take 4 mg by mouth 4 (four) times daily as needed for diarrhea or loose stools.   Yes [provider]  albuterol (PROVENTIL HFA;VENTOLIN HFA) 108 (90 BASE) MCG/ACT inhaler Inhale 1-2 puffs into the lungs every 6 (six) hours as needed for wheezing or shortness of breath. Patient not taking: Reported on 11/22/2016 08/13/14   Marlon Pel, PA-C  cetirizine (ZYRTEC ALLERGY) 10 MG tablet Take 1 tablet (10 mg total) by mouth daily. Patient not taking: Reported on 11/22/2016 04/03/16   Everlene Farrier, PA-C  fluticasone [facility]) 50 MCG/ACT nasal spray Place 1 spray into both nostrils daily. Patient not taking: Reported on 11/22/2016 04/03/16   Everlene Farrier, PA-C  hydrOXYzine (ATARAX/VISTARIL) 25 MG tablet Take 1 tablet (25 mg total) by mouth every 6 (six) hours as needed for anxiety. Patient not taking: Reported on 11/22/2016 11/18/15   Tilden Fossa, MD  LORazepam (ATIVAN) 0.5 MG tablet Take 1 tablet (0.5 mg total) by mouth  2 (two) times daily as needed for anxiety (use sparingly, no more than two times a day, for severe episodes of anxiety and/or panic attacks). Patient not taking: Reported on 11/22/2016 11/20/15   Danelle Berry, PA-C  methocarbamol (ROBAXIN) 500 MG tablet Take 1 tablet (500 mg total) by mouth 2 (two) times daily. Patient not taking: Reported on 11/20/2015 07/07/15   Santiago Glad, PA-C    Family History Family History  Problem Relation Age of Onset  . Hypertension Other   . Diabetes Other     Social History Social History  Substance Use Topics  .  Smoking status: Never Smoker  . Smokeless tobacco: Never Used  . Alcohol use No   Allergies   Morphine and related and Oxycodone  Review of Systems Review of Systems  Constitutional: Negative for fever.  Gastrointestinal: Positive for abdominal pain and nausea. Negative for blood in stool and vomiting.  Neurological: Negative for dizziness and light-headedness.  All other systems reviewed and are negative.  Physical Exam Updated Vital Signs BP (!) 143/88 (BP Location: Left Arm)   Pulse 85   Temp 98.6 F (37 C) (Oral)   Resp 20   Ht 5\' 6"  (1.676 m)   Wt 127 kg (280 lb)   SpO2 98%   BMI 45.19 kg/m   Physical Exam  Constitutional: She is oriented to person, place, and time. She appears well-developed and well-nourished.  HENT:  Head: Normocephalic.  Eyes: EOM are normal.  Sclera is clear.  Neck: Normal range of motion.  Cardiovascular: Normal rate and regular rhythm.   2+ radial pulses bilaterally.  Pulmonary/Chest: Effort normal.  Abdominal: Soft. She exhibits no distension. There is no tenderness.  No focal tenderness.  Musculoskeletal: Normal range of motion.  Neurological: She is alert and oriented to person, place, and time.  Skin: Capillary refill takes 2 to 3 seconds.  Psychiatric: She has a normal mood and affect.  Nursing note and vitals reviewed.  ED Treatments / Results  DIAGNOSTIC STUDIES: Oxygen Saturation is 98% on RA, normal by my interpretation.   COORDINATION OF CARE: 1:36 AM-Discussed next steps with pt. Pt verbalized understanding and is agreeable with the plan.   Labs (all labs ordered are listed, but only abnormal results are displayed) Labs Reviewed  COMPREHENSIVE METABOLIC PANEL - Abnormal; Notable for the following:       Result Value   Total Protein 8.2 (*)    All other components within normal limits  CBC - Abnormal; Notable for the following:    WBC 11.0 (*)    RBC 5.25 (*)    MCV 73.7 (*)    MCH 24.8 (*)    All other  components within normal limits  URINALYSIS, ROUTINE W REFLEX MICROSCOPIC  MAGNESIUM  POC URINE PREG, ED    EKG  EKG Interpretation None       Radiology No results found.  Procedures Procedures (including critical care time)  Medications Ordered in ED Medications - No data to display  Initial Impression / Assessment and Plan / ED Course  I have reviewed the triage vital signs and the nursing notes.  Pertinent labs & imaging results that were available during my care of the patient were reviewed by me and considered in my medical decision making (see chart for details).     Pt comes in with loose BM. She is having 3 episodes / day for the last 8 days. I am not sure if she is having true diarrhea. Pt has  intermittent abd cramping, mostly in the upper quadrants. Currently, pt has no abd tenderness, specifically in the epigastric region or RUQ. Pt doesn't appear profoundly dehydrated either. Labs were ordered from triage and are reassuring. There is family hx of IBS, and this could be early presentation of IBS.  Strict ER return precautions have been discussed, and patient is agreeing with the plan and is comfortable with the workup done and the recommendations from the ER.    Final Clinical Impressions(s) / ED Diagnoses   Final diagnoses:  Diarrhea, unspecified type   New Prescriptions New Prescriptions   No medications on file   I personally performed the services described in this documentation, which was scribed in my presence. The recorded information has been reviewed and is accurate.     Derwood KaplanNanavati, Tauheed Mcfayden, MD 11/22/16 [phone]

## 2017-06-12 IMAGING — CR DG CHEST 2V
2 series · 2 of 2 positions shown · non-contrast
Comparison: Chest radiograph from 08/13/2014

CLINICAL DATA: Acute onset of wheezing and productive cough.
Initial encounter.

EXAM:
CHEST  2 VIEW

[chest pa]
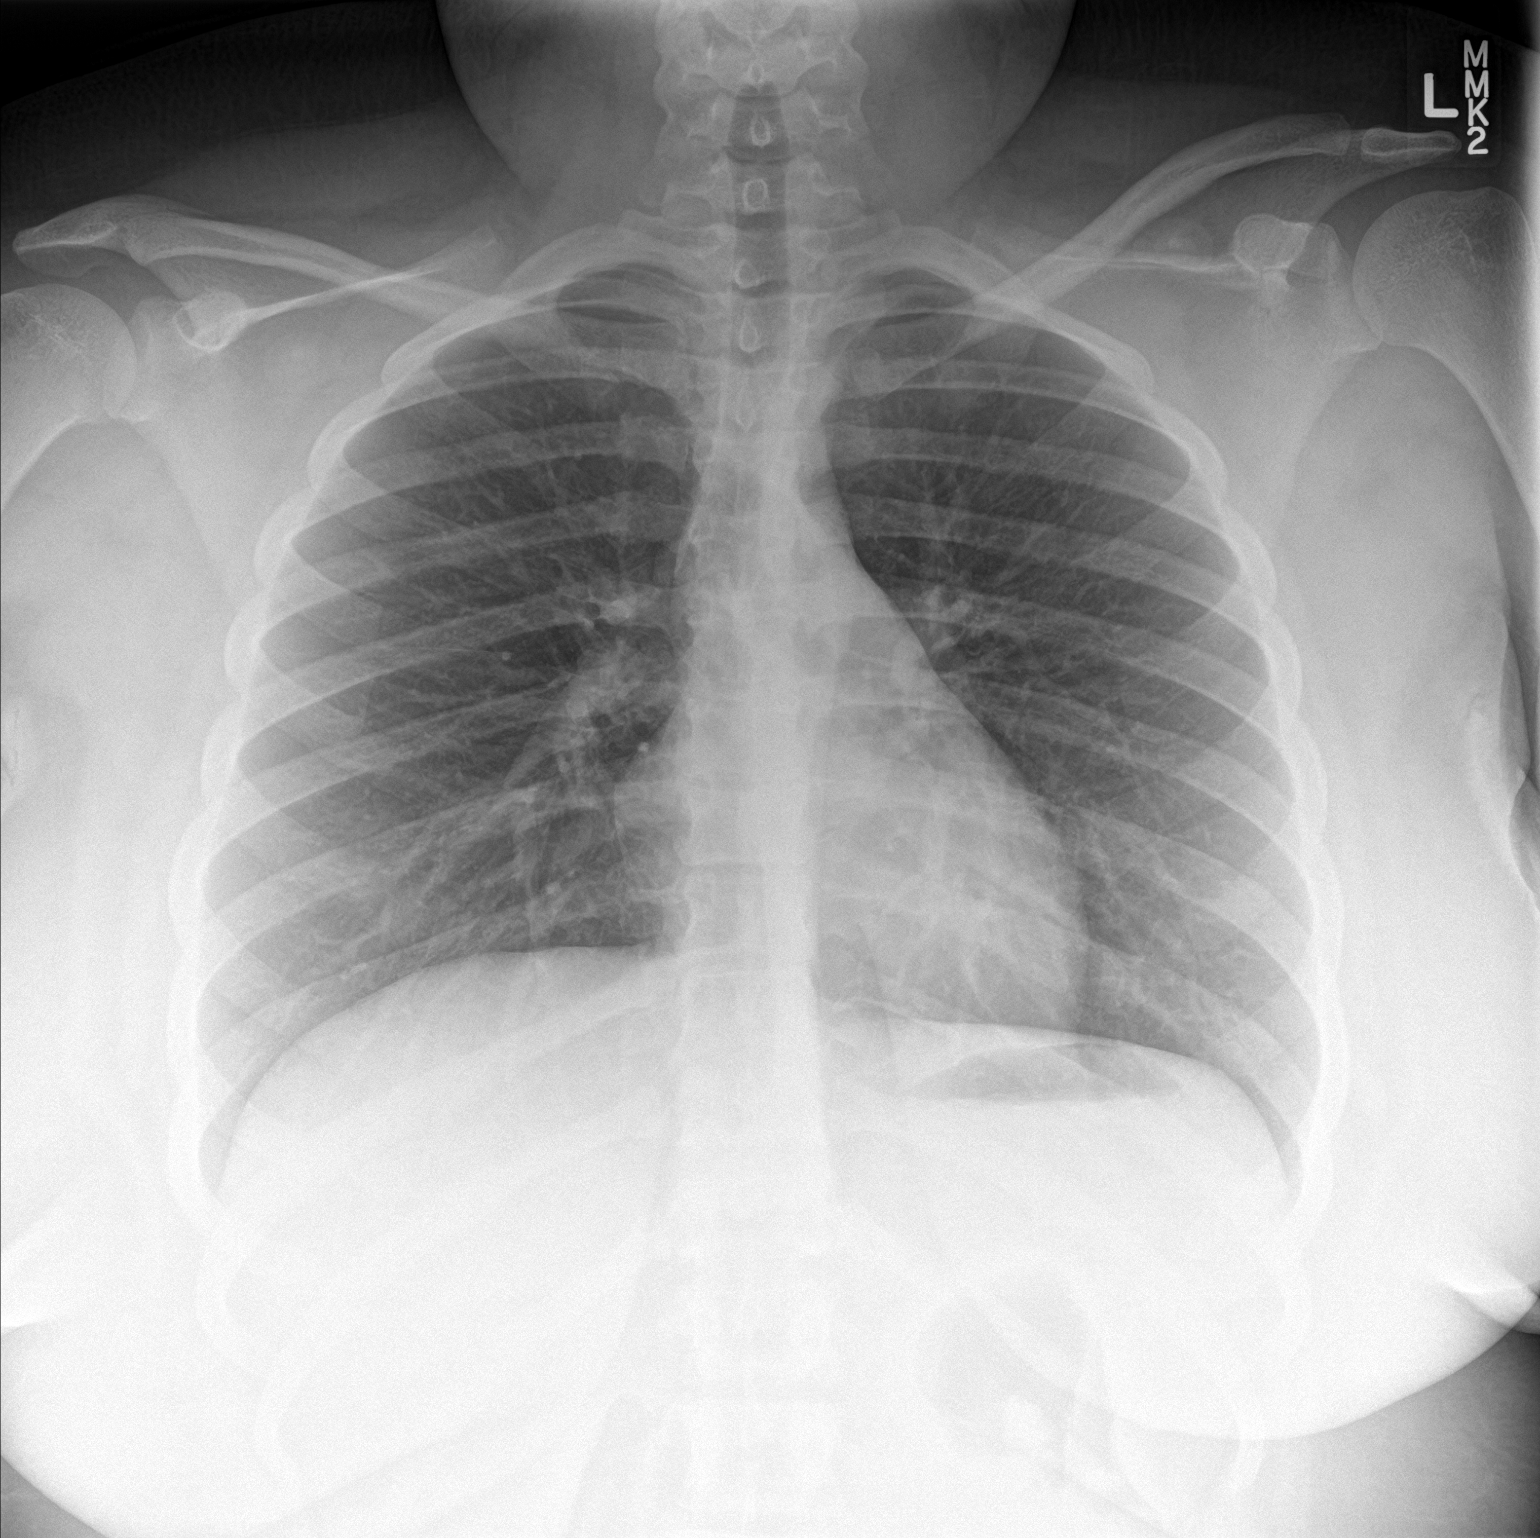

[chest lat]
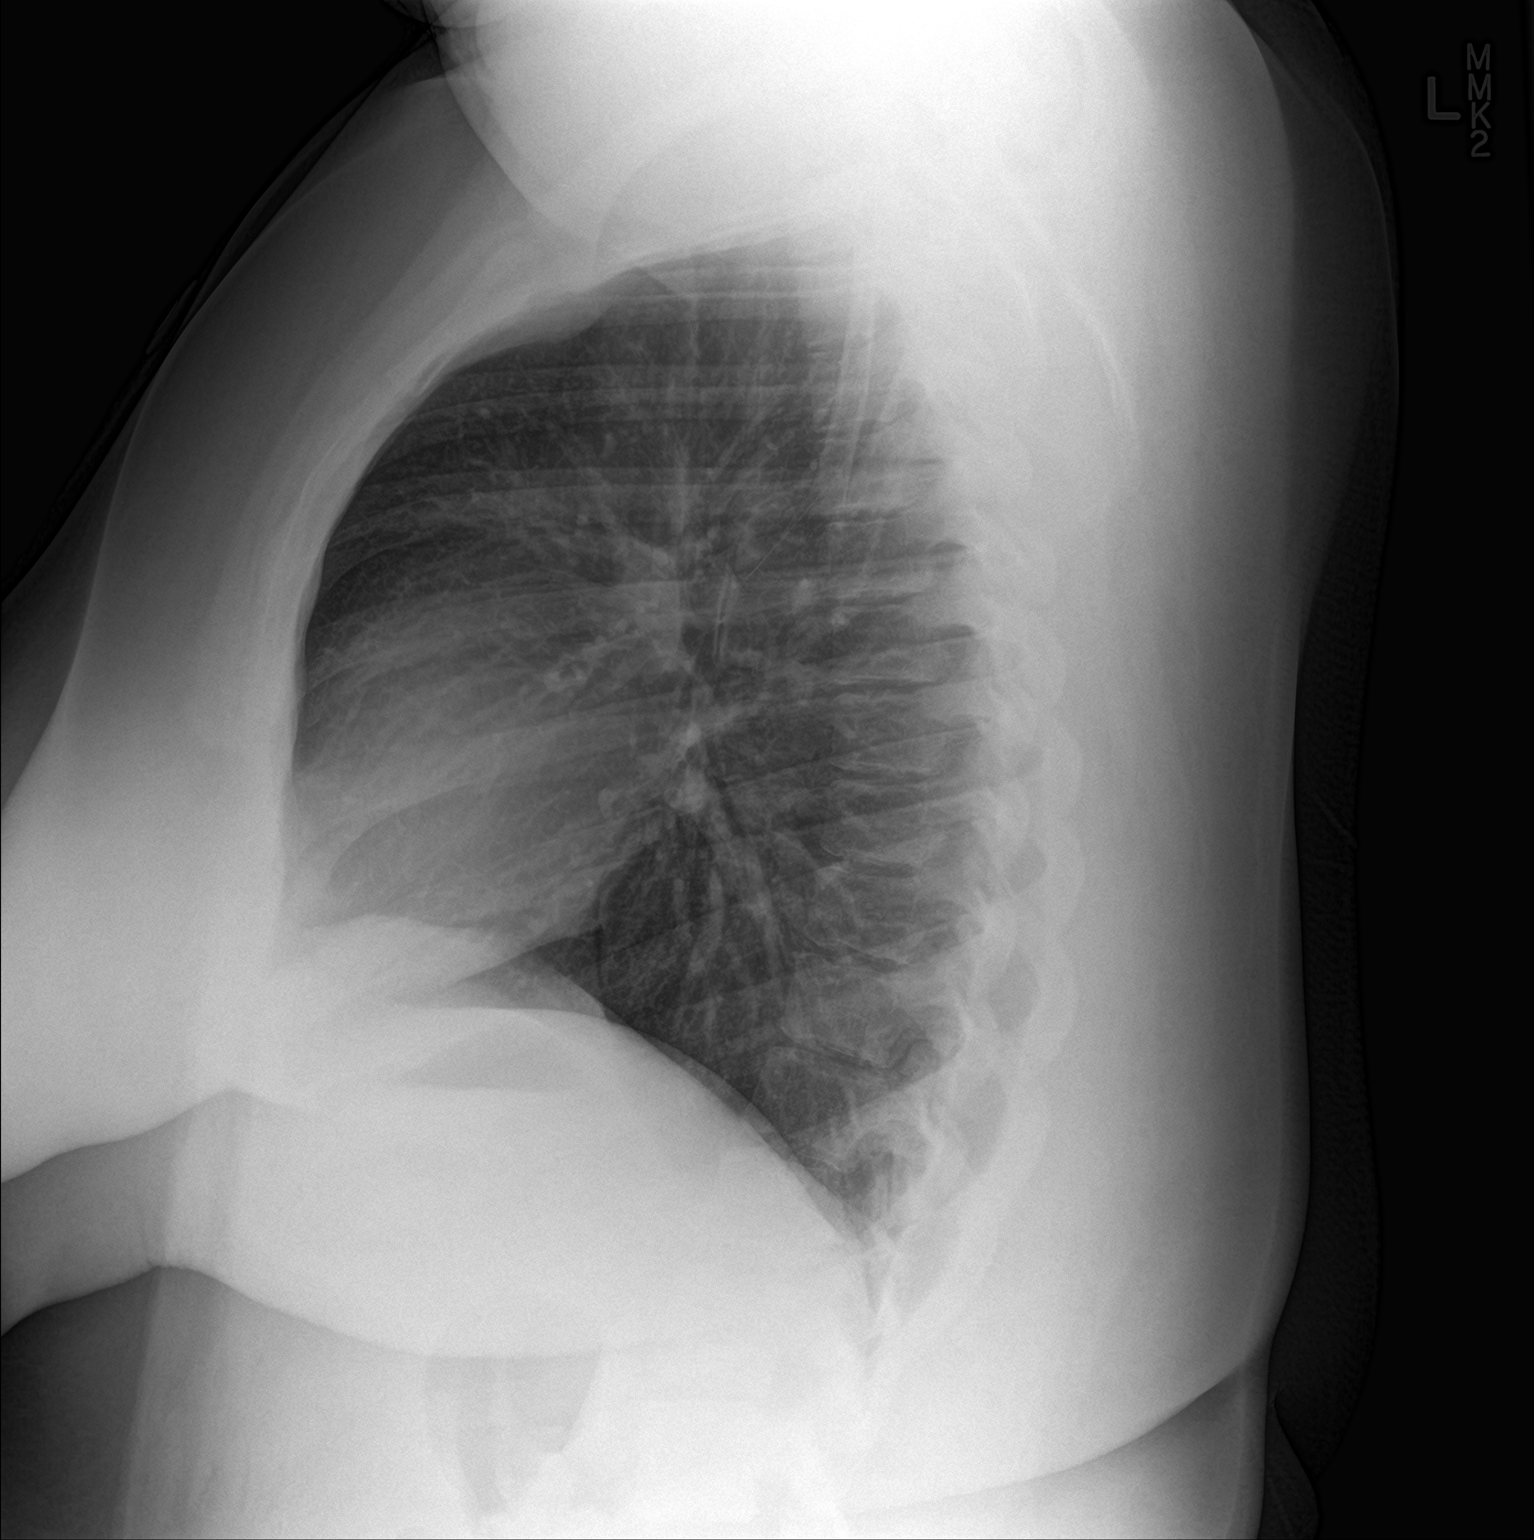

[2 of 2 positions shown; findings below may reference images not displayed]

FINDINGS: The lungs are well-aerated and clear. There is no evidence of focal
opacification, pleural effusion or pneumothorax.

The heart is normal in size; the mediastinal contour is within
normal limits. No acute osseous abnormalities are seen.
IMPRESSION: No acute cardiopulmonary process seen.

## 2017-08-09 ENCOUNTER — Ambulatory Visit: Payer: Self-pay | Admitting: Women's Health

## 2018-03-17 ENCOUNTER — Ambulatory Visit
Admission: RE | Admit: 2018-03-17 | Discharge: 2018-03-17 | Disposition: A | Discharge status: Home / Self Care | Payer: Self-pay | Source: Ambulatory Visit | Attending: Family Medicine | Admitting: Family Medicine

## 2018-03-17 ENCOUNTER — Other Ambulatory Visit: Payer: Self-pay | Admitting: Family Medicine

## 2018-03-17 DIAGNOSIS — R52 Pain, unspecified: Secondary | ICD-10-CM

## 2018-04-27 ENCOUNTER — Other Ambulatory Visit: Payer: Self-pay

## 2018-04-27 ENCOUNTER — Emergency Department (HOSPITAL_COMMUNITY): Payer: Self-pay

## 2018-04-27 ENCOUNTER — Encounter (HOSPITAL_COMMUNITY): Payer: Self-pay | Admitting: Emergency Medicine

## 2018-04-27 ENCOUNTER — Emergency Department (HOSPITAL_COMMUNITY)
Admission: EM | Admit: 2018-04-27 | Discharge: 2018-04-27 | Disposition: A | Discharge status: Home / Self Care | Payer: Self-pay | Attending: Emergency Medicine | Admitting: Emergency Medicine

## 2018-04-27 DIAGNOSIS — M545 Low back pain, unspecified: Secondary | ICD-10-CM

## 2018-04-27 DIAGNOSIS — F419 Anxiety disorder, unspecified: Secondary | ICD-10-CM | POA: Insufficient documentation

## 2018-04-27 DIAGNOSIS — J45909 Unspecified asthma, uncomplicated: Secondary | ICD-10-CM | POA: Insufficient documentation

## 2018-04-27 LAB — PREGNANCY, URINE: PREG TEST UR: NEGATIVE

## 2018-04-27 MED ORDER — NAPROXEN 375 MG PO TABS: 375.0000 mg | ORAL_TABLET | Freq: Two times a day (BID) | ORAL | 0 refills | Status: DC

## 2018-04-27 MED ORDER — ACETAMINOPHEN 500 MG PO TABS
1000.0000 mg | ORAL_TABLET | Freq: Once | ORAL | Status: AC
  Administered 2018-04-27: 1000 mg via ORAL
  Filled 2018-04-27: qty 2

## 2018-04-27 NOTE — Discharge Instructions (Signed)
Your x-ray today was reassuring.  Take naproxen as prescribed.  Follow-up with your primary care provider within a week for continued evaluation.  Return to the ED immediately for new or worsening symptoms, such as weakness, numbness, difficulty urinating, bowel incontinence, fevers or any concerns at all.

## 2018-04-27 NOTE — ED Notes (Signed)
Signature pad not available. Patient verbalized understanding of discharge instructions.   

## 2018-04-27 NOTE — ED Provider Notes (Signed)
[facility] EMERGENCY DEPARTMENT Provider Note   CSN: [id number] Arrival date & time: 04/27/18  1259     History   Chief Complaint Chief Complaint  Patient presents with  . Back Pain    HPI Melissa Ramirez is a 22 y.o. female.  HPI  22 year old female, with no significant medical history, presents with low back pain.  Patient states pain started suddenly after lifting heavy groceries.  She describes pain as the midline low back radiating to bilateral paraspinal regions.  She denies any pain radiating down her legs.  She denies any bowel or bladder incontinence, saddle anesthesias, numbness, tingling, difficulty ambulating.  Patient denies any direct injury or trauma to the back.  She denies any fevers, chills, dysuria, urinary urgency, urinary frequency, vaginal bleeding, vaginal discharge.   Past Medical History:  Diagnosis Date  . Anxiety   . Asthma   . Obesity   . PCOS (polycystic ovarian syndrome)     Patient Active Problem List   Diagnosis Date Noted  . Obesity 02/21/2014  . PCOS (polycystic ovarian syndrome) 02/21/2014  . Asthma 02/21/2014  . Fibrocystic breast changes of both breasts 02/21/2014    Past Surgical History:  Procedure Laterality Date  . FOOT SURGERY  2009  . TONSILLECTOMY    . tubes in ears       OB History    Gravida  0   Para  0   Term  0   Preterm  0   AB  0   Living  0     SAB  0   TAB  0   Ectopic  0   Multiple  0   Live Births               Home Medications    Prior to Admission medications   Medication Sig Start Date End Date Taking? Authorizing Provider  albuterol (PROVENTIL HFA;VENTOLIN HFA) 108 (90 BASE) MCG/ACT inhaler Inhale 1-2 puffs into the lungs every 6 (six) hours as needed for wheezing or shortness of breath. Patient not taking: Reported on 11/22/2016 08/13/14   Marlon Pel, PA-C  bismuth subsalicylate (PEPTO BISMOL) 262 MG/15ML suspension Take 30 mLs by mouth every 6 (six) hours  as needed for diarrhea or loose stools.    [provider]  cetirizine (ZYRTEC ALLERGY) 10 MG tablet Take 1 tablet (10 mg total) by mouth daily. Patient not taking: Reported on 11/22/2016 04/03/16   Everlene Farrier, PA-C  fluticasone [facility]) 50 MCG/ACT nasal spray Place 1 spray into both nostrils daily. Patient not taking: Reported on 11/22/2016 04/03/16   Everlene Farrier, PA-C  hydrOXYzine (ATARAX/VISTARIL) 25 MG tablet Take 1 tablet (25 mg total) by mouth every 6 (six) hours as needed for anxiety. Patient not taking: Reported on 11/22/2016 11/18/15   Tilden Fossa, MD  loperamide (IMODIUM A-D) 2 MG tablet Take 4 mg by mouth 4 (four) times daily as needed for diarrhea or loose stools.    [provider]  LORazepam (ATIVAN) 0.5 MG tablet Take 1 tablet (0.5 mg total) by mouth 2 (two) times daily as needed for anxiety (use sparingly, no more than two times a day, for severe episodes of anxiety and/or panic attacks). Patient not taking: Reported on 11/22/2016 11/20/15   Danelle Berry, PA-C  methocarbamol (ROBAXIN) 500 MG tablet Take 1 tablet (500 mg total) by mouth 2 (two) times daily. Patient not taking: Reported on 11/20/2015 07/07/15   Santiago Glad, PA-C    Family History  Family History  Problem Relation Age of Onset  . Hypertension Other   . Diabetes Other     Social History Social History   Tobacco Use  . Smoking status: Never Smoker  . Smokeless tobacco: Never Used  Substance Use Topics  . Alcohol use: No  . Drug use: No     Allergies   Morphine and related and Oxycodone   Review of Systems Review of Systems  Constitutional: Negative for chills and fever.  Respiratory: Negative for shortness of breath.   Cardiovascular: Negative for chest pain.  Gastrointestinal: Negative for abdominal pain, nausea and vomiting.  Genitourinary: Negative for dysuria, vaginal bleeding and vaginal discharge.  Musculoskeletal: Positive for back pain. Negative for gait  problem and neck pain.     Physical Exam Updated Vital Signs BP 116/79 (BP Location: Right Arm)   Pulse 96   Temp 98.1 F (36.7 C) (Oral)   Resp 18   Ht 5\' 6"  (1.676 m)   Wt 131.5 kg   SpO2 100%   BMI 46.81 kg/m   Physical Exam  Constitutional: She is oriented to person, place, and time. She appears well-developed and well-nourished.  HENT:  Head: Normocephalic and atraumatic.  Eyes: Conjunctivae and EOM are normal.  Neck: Neck supple.  Cardiovascular: Normal rate, regular rhythm and normal heart sounds.  No murmur heard. Pulmonary/Chest: Effort normal and breath sounds normal. No respiratory distress. She has no wheezes. She has no rales.  Abdominal: Soft. Bowel sounds are normal. She exhibits no distension. There is no tenderness.  Musculoskeletal: Normal range of motion. She exhibits no deformity.       Lumbar back: She exhibits tenderness (paraspinal tenderness of lumbar spine) and bony tenderness (midline tenderness to palpation over entire lumbar spine). She exhibits no edema, no deformity and no laceration.  Neurological: She is alert and oriented to person, place, and time.  Skin: Skin is warm and dry. No rash noted. No erythema.  Psychiatric: She has a normal mood and affect. Her behavior is normal.  Nursing note and vitals reviewed.    ED Treatments / Results  Labs (all labs ordered are listed, but only abnormal results are displayed) Labs Reviewed  PREGNANCY, URINE    EKG None  Radiology No results found.  Procedures Procedures (including critical care time)  Medications Ordered in ED Medications  acetaminophen (TYLENOL) tablet 1,000 mg (has no administration in time range)     Initial Impression / Assessment and Plan / ED Course  I have reviewed the triage vital signs and the nursing notes.  Pertinent labs & imaging results that were available during my care of the patient were reviewed by me and considered in my medical decision making (see  chart for details).     Resting comfortably in chair, no acute distress, nontoxic, non-lethargic.  No signs stable.  X-ray shows no acute fractures.  Patient has no evidence of cauda equina, radiculopathy.  Will start on NSAID.  Patient ambulatory in the ED.  Given strict return precautions.  Urged close follow-up with primary care provider.   At this time there does not appear to be any evidence of an acute emergency medical condition and the patient appears stable for discharge with appropriate outpatient follow up.Diagnosis was discussed with patient who verbalizes understanding and is agreeable to discharge.   Final Clinical Impressions(s) / ED Diagnoses   Final diagnoses:  None    ED Discharge Orders    None       Roe Wilner,  Danley Danker, PA-C 04/27/18 1511    Gwyneth Sprout, MD 04/27/18 1525

## 2018-04-27 NOTE — ED Triage Notes (Signed)
Pt reports pain midline lower back after lifting groceries. Pain is worse with movement. VSS.

## 2019-03-10 ENCOUNTER — Emergency Department (HOSPITAL_COMMUNITY): Payer: Medicaid Other

## 2019-03-10 ENCOUNTER — Other Ambulatory Visit: Payer: Self-pay

## 2019-03-10 ENCOUNTER — Emergency Department (HOSPITAL_COMMUNITY)
Admission: EM | Admit: 2019-03-10 | Discharge: 2019-03-10 | Disposition: A | Discharge status: Home / Self Care | Payer: Medicaid Other | Attending: Emergency Medicine | Admitting: Emergency Medicine

## 2019-03-10 ENCOUNTER — Encounter (HOSPITAL_COMMUNITY): Payer: Self-pay | Admitting: *Deleted

## 2019-03-10 DIAGNOSIS — J45909 Unspecified asthma, uncomplicated: Secondary | ICD-10-CM | POA: Insufficient documentation

## 2019-03-10 DIAGNOSIS — M79641 Pain in right hand: Secondary | ICD-10-CM | POA: Insufficient documentation

## 2019-03-10 DIAGNOSIS — R2231 Localized swelling, mass and lump, right upper limb: Secondary | ICD-10-CM | POA: Insufficient documentation

## 2019-03-10 MED ORDER — IBUPROFEN 200 MG PO TABS
400.0000 mg | ORAL_TABLET | Freq: Once | ORAL | Status: AC
  Administered 2019-03-10: 400 mg via ORAL
  Filled 2019-03-10: qty 2

## 2019-03-10 NOTE — Discharge Instructions (Signed)
Take Tylenol or Motrin as needed for pain.  Wear thumb spica while sleeping or while at work.  Follow-up with your primary care provider within a week for continued evaluation.  Return to the ED immediately for new or worsening symptoms or concerns such as numbness, decreased range of motion, fevers, increased swelling or any concerns at all.

## 2019-03-10 NOTE — ED Provider Notes (Signed)
[facility]-EMERGENCY DEPT Provider Note   CSN: [id number] Arrival date & time: 03/10/19  1605     History   Chief Complaint Chief Complaint  Patient presents with  . Hand Pain    rt    HPI Melissa Ramirez is a 23 y.o. female.     HPI   23 year old female presents with right hand pain.  Patient states that she started having pain after she started a new job.  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  She states that since then she has started having pain and swelling to the right hand of the thumb pointer and middle fingers.  She notes that it was also worse this morning after she woke up.  Pain has been gradually improved today since not being at work.  She denies any redness, injury.  She denies any fevers, chills, decreased range of motion, numbness, tingling.  Past Medical History:  Diagnosis Date  . Anxiety   . Asthma   . Obesity   . PCOS (polycystic ovarian syndrome)     Patient Active Problem List   Diagnosis Date Noted  . Obesity 02/21/2014  . PCOS (polycystic ovarian syndrome) 02/21/2014  . Asthma 02/21/2014  . Fibrocystic breast changes of both breasts 02/21/2014    Past Surgical History:  Procedure Laterality Date  . FOOT SURGERY  2009  . TONSILLECTOMY    . tubes in ears       OB History    Gravida  0   Para  0   Term  0   Preterm  0   AB  0   Living  0     SAB  0   TAB  0   Ectopic  0   Multiple  0   Live Births               Home Medications    Prior to Admission medications   Medication Sig Start Date End Date Taking? Authorizing Provider  albuterol (PROVENTIL HFA;VENTOLIN HFA) 108 (90 BASE) MCG/ACT inhaler Inhale 1-2 puffs into the lungs every 6 (six) hours as needed for wheezing or shortness of breath. Patient not taking: Reported on 11/22/2016 08/13/14   Marlon PelGreene, Tiffany, PA-C  bismuth subsalicylate (PEPTO BISMOL) 262 MG/15ML suspension Take 30 mLs by mouth every 6  (six) hours as needed for diarrhea or loose stools.    [provider]  cetirizine (ZYRTEC ALLERGY) 10 MG tablet Take 1 tablet (10 mg total) by mouth daily. Patient not taking: Reported on 11/22/2016 04/03/16   Everlene Farrieransie, William, PA-C  fluticasone [facility](FLONASE) 50 MCG/ACT nasal spray Place 1 spray into both nostrils daily. Patient not taking: Reported on 11/22/2016 04/03/16   Everlene Farrieransie, William, PA-C  hydrOXYzine (ATARAX/VISTARIL) 25 MG tablet Take 1 tablet (25 mg total) by mouth every 6 (six) hours as needed for anxiety. Patient not taking: Reported on 11/22/2016 11/18/15   Tilden Fossaees, Elizabeth, MD  loperamide (IMODIUM A-D) 2 MG tablet Take 4 mg by mouth 4 (four) times daily as needed for diarrhea or loose stools.    [provider]  LORazepam (ATIVAN) 0.5 MG tablet Take 1 tablet (0.5 mg total) by mouth 2 (two) times daily as needed for anxiety (use sparingly, no more than two times a day, for severe episodes of anxiety and/or panic attacks). Patient not taking: Reported on 11/22/2016 11/20/15   Danelle Berryapia, Leisa, PA-C  methocarbamol (ROBAXIN) 500 MG tablet Take 1 tablet (  500 mg total) by mouth 2 (two) times daily. Patient not taking: Reported on 11/20/2015 07/07/15   Hyman Bible, PA-C  naproxen (NAPROSYN) 375 MG tablet Take 1 tablet (375 mg total) by mouth 2 (two) times daily. 04/27/18   Etter Sjogren, PA-C    Family History Family History  Problem Relation Age of Onset  . Hypertension Other   . Diabetes Other     Social History Social History   Tobacco Use  . Smoking status: Never Smoker  . Smokeless tobacco: Never Used  Substance Use Topics  . Alcohol use: No  . Drug use: No     Allergies   Morphine and related and Oxycodone   Review of Systems Review of Systems  Constitutional: Negative for chills and fever.  Respiratory: Negative for shortness of breath.   Cardiovascular: Negative for chest pain.  Gastrointestinal: Negative for abdominal pain, nausea and vomiting.   Musculoskeletal: Positive for arthralgias (right hand pain).     Physical Exam Updated Vital Signs BP (!) 152/86 (BP Location: Left Arm)   Pulse 98   Temp 99 F (37.2 C) (Oral)   Resp 18   LMP 03/10/2019   SpO2 98%   Physical Exam Vitals signs and nursing note reviewed.  Constitutional:      Appearance: She is well-developed.  HENT:     Head: Normocephalic and atraumatic.  Eyes:     Conjunctiva/sclera: Conjunctivae normal.  Neck:     Musculoskeletal: Neck supple.  Cardiovascular:     Rate and Rhythm: Normal rate and regular rhythm.     Heart sounds: Normal heart sounds. No murmur.  Pulmonary:     Effort: Pulmonary effort is normal. No respiratory distress.     Breath sounds: Normal breath sounds. No wheezing or rales.  Abdominal:     General: Bowel sounds are normal. There is no distension.     Palpations: Abdomen is soft.     Tenderness: There is no abdominal tenderness.  Musculoskeletal: Normal range of motion.        General: No tenderness or deformity.       Arms:     Right hand: She exhibits swelling (minimal). She exhibits normal range of motion, no tenderness and normal capillary refill. Normal sensation noted. Normal strength noted.     Comments: Positive Phalen's of the right hand  Skin:    General: Skin is warm and dry.     Findings: No erythema or rash.  Neurological:     Mental Status: She is alert and oriented to person, place, and time.  Psychiatric:        Behavior: Behavior normal.      ED Treatments / Results  Labs (all labs ordered are listed, but only abnormal results are displayed) Labs Reviewed - No data to display  EKG None   Radiology Dg Hand Complete Right  Result Date: 03/10/2019 CLINICAL DATA:  Pain EXAM: RIGHT HAND - COMPLETE 3+ VIEW COMPARISON:  None. FINDINGS: No fracture or dislocation of the right hand. Joint spaces are preserved. Soft tissues are unremarkable. IMPRESSION: No fracture or dislocation of the right hand. Joint  spaces are preserved. Electronically Signed   By: Eddie Candle M.D.   On: 03/10/2019 18:21    Procedures Procedures (including critical care time)  Medications Ordered in ED Medications  ibuprofen (ADVIL) tablet 400 mg (400 mg Oral Given 03/10/19 1812)     Initial Impression / Assessment and Plan / ED Course  I have reviewed the  triage vital signs and the nursing notes.  Pertinent labs & imaging results that were available during my care of the patient were reviewed by me and considered in my medical decision making (see chart for details).        Patient presents with right hand pain.  She does have minimal swelling noted to the right hand at the thumb, pointer and middle finger.  There is no erythema, rash, abscess.  He has full range of motion, normal capillary refill, bounding radial pulse.  Strength is 5 out of 5 in bilateral hands.  X-ray shows no acute fracture or dislocation.  She has a positive Phalen's on the right side.  History and physical most consistent with a possible carpal tunnel.  Will place in a thumb spica and encouraged rest.  Given return precautions.  Patient ready and stable for discharge.   At this time there does not appear to be any evidence of an acute emergency medical condition and the patient appears stable for discharge with appropriate outpatient follow up.Diagnosis was discussed with patient who verbalizes understanding and is agreeable to discharge.    Final Clinical Impressions(s) / ED Diagnoses   Final diagnoses:  Right hand pain    ED Discharge Orders    None       Rueben Bash 03/10/19 2103    Loren Racer, MD 03/14/19 2117

## 2019-03-10 NOTE — ED Triage Notes (Signed)
Started new job as Engineer, petroleum, she now has pain in rt hand and slight swelling with pain going up rt arm. No noted injury

## 2019-06-05 ENCOUNTER — Ambulatory Visit (HOSPITAL_COMMUNITY)
Admission: EM | Admit: 2019-06-05 | Discharge: 2019-06-05 | Disposition: A | Discharge status: Home / Self Care | Payer: Medicaid Other | Attending: Family Medicine | Admitting: Family Medicine

## 2019-06-05 ENCOUNTER — Encounter (HOSPITAL_COMMUNITY): Payer: Self-pay

## 2019-06-05 ENCOUNTER — Other Ambulatory Visit: Payer: Self-pay

## 2019-06-05 DIAGNOSIS — Z885 Allergy status to narcotic agent status: Secondary | ICD-10-CM | POA: Diagnosis not present

## 2019-06-05 DIAGNOSIS — R062 Wheezing: Secondary | ICD-10-CM | POA: Diagnosis present

## 2019-06-05 DIAGNOSIS — R05 Cough: Secondary | ICD-10-CM | POA: Insufficient documentation

## 2019-06-05 DIAGNOSIS — Z833 Family history of diabetes mellitus: Secondary | ICD-10-CM | POA: Insufficient documentation

## 2019-06-05 DIAGNOSIS — R5383 Other fatigue: Secondary | ICD-10-CM | POA: Diagnosis not present

## 2019-06-05 DIAGNOSIS — Z20828 Contact with and (suspected) exposure to other viral communicable diseases: Secondary | ICD-10-CM | POA: Insufficient documentation

## 2019-06-05 DIAGNOSIS — J45909 Unspecified asthma, uncomplicated: Secondary | ICD-10-CM | POA: Insufficient documentation

## 2019-06-05 DIAGNOSIS — Z8249 Family history of ischemic heart disease and other diseases of the circulatory system: Secondary | ICD-10-CM | POA: Diagnosis not present

## 2019-06-05 DIAGNOSIS — Z6841 Body Mass Index (BMI) 40.0 and over, adult: Secondary | ICD-10-CM | POA: Diagnosis not present

## 2019-06-05 DIAGNOSIS — E669 Obesity, unspecified: Secondary | ICD-10-CM | POA: Diagnosis not present

## 2019-06-05 DIAGNOSIS — R059 Cough, unspecified: Secondary | ICD-10-CM

## 2019-06-05 DIAGNOSIS — R030 Elevated blood-pressure reading, without diagnosis of hypertension: Secondary | ICD-10-CM | POA: Diagnosis present

## 2019-06-05 MED ORDER — BENZONATATE 100 MG PO CAPS: ORAL_CAPSULE | ORAL | 0 refills | Status: DC

## 2019-06-05 MED ORDER — PREDNISONE 10 MG (21) PO TBPK: ORAL_TABLET | Freq: Every day | ORAL | 0 refills | Status: DC

## 2019-06-05 NOTE — Discharge Instructions (Signed)
Your blood pressure was noted to be elevated during your visit today. You may return here within the next few days to recheck if unable to see your primary care doctor. If your blood pressure remains persistently elevated, you may need to begin taking a medication.  BP (!) 155/65 (BP Location: Right Arm)   Pulse 100   Temp 99.7 F (37.6 C)   Resp 18   Wt (!) 143.8 kg   LMP 06/01/2019   SpO2 100%   BMI 51.17 kg/m

## 2019-06-05 NOTE — ED Provider Notes (Signed)
[facility]   [id number] 06/05/19 Arrival Time: 1432  ASSESSMENT & PLAN:  1. Cough   2. Wheezing   3. Elevated blood pressure reading without diagnosis of hypertension     COVID testing sent. See work note for [employer].  Begin: Meds ordered this encounter  Medications  . predniSONE (STERAPRED UNI-PAK 21 TAB) 10 MG (21) TBPK tablet    Sig: Take by mouth daily. Take as directed.    Dispense:  21 tablet    Refill:  0  . benzonatate (TESSALON) 100 MG capsule    Sig: Take 1 capsule by mouth every 8 (eight) hours for cough.    Dispense:  21 capsule    Refill:  0     Discharge Instructions     Your blood pressure was noted to be elevated during your visit today. You may return here within the next few days to recheck if unable to see your primary care doctor. If your blood pressure remains persistently elevated, you may need to begin taking a medication.  BP (!) 155/65 (BP Location: Right Arm)   Pulse 100   Temp 99.7 F (37.6 C)   Resp 18   Wt (!) 143.8 kg   LMP 06/01/2019   SpO2 100%   BMI 51.17 kg/m      Pending: Labs Reviewed  NOVEL CORONAVIRUS, NAA (HOSP ORDER, SEND-OUT TO REF LAB; TAT 18-24 HRS)     OTC symptom care as needed. Ensure adequate fluid intake and rest. May f/u with PCP or here as needed.  Reviewed expectations re: course of current medical issues. Questions answered. Outlined signs and symptoms indicating need for more acute intervention. Patient verbalized understanding. After Visit Summary given.   SUBJECTIVE: History from: patient.  Melissa Ramirez is a 23 y.o. female who presents with complaint of a persistent cough for a few weeks. Dry. Some fatigue and without body aches. SOB: none. Wheezing: questions at times. Fever: absent. Overall normal PO intake without n/v. Known sick contacts or COVID-19 exposure: no. No specific or significant aggravating or alleviating factors reported. OTC treatment: cough  medications without much relief..  Increased blood pressure noted today. Reports that she has not been treated for hypertension in the past.  She reports no chest pain on exertion, no dyspnea on exertion, no swelling of ankles, no orthostatic dizziness or lightheadedness, no orthopnea or paroxysmal nocturnal dyspnea, no palpitations and no intermittent claudication symptoms.   Social History   Tobacco Use  Smoking Status Never Smoker  Smokeless Tobacco Never Used    ROS: As per HPI. All other systems negative.    OBJECTIVE:  Vitals:   06/05/19 1540 06/05/19 1542  BP:  (!) 155/65  Pulse:  100  Resp:  18  Temp:  99.7 F (37.6 C)  SpO2:  100%  Weight: (!) 143.8 kg      General appearance: alert; appears fatigued HEENT: mild nasal congestion Neck: supple without LAD; FROM CV: RRR Lungs: unlabored respirations, symmetrical air entry with mild expiratory wheezing at bases; cough: mild, dry Abd: soft Ext: no LE edema Skin: warm and dry Neuro: normal gait Psychological: alert and cooperative; normal mood and affect    Allergies  Allergen Reactions  . Morphine And Related Itching  . Oxycodone Itching    Past Medical History:  Diagnosis Date  . Anxiety   . Asthma   . Obesity   . PCOS (polycystic ovarian syndrome)    Family History  Problem Relation Age of Onset  .  Hypertension Other   . Diabetes Other    Social History   Socioeconomic History  . Marital status: Single    Spouse name: Not on file  . Number of children: Not on file  . Years of education: Not on file  . Highest education level: Not on file  Occupational History  . Not on file  Tobacco Use  . Smoking status: Never Smoker  . Smokeless tobacco: Never Used  Substance and Sexual Activity  . Alcohol use: No  . Drug use: No  . Sexual activity: Yes    Birth control/protection: None  Other Topics Concern  . Not on file  Social History Narrative  . Not on file   Social Determinants of  Health   Financial Resource Strain:   . Difficulty of Paying Living Expenses: Not on file  Food Insecurity:   . Worried About Charity fundraiser in the Last Year: Not on file  . Ran Out of Food in the Last Year: Not on file  Transportation Needs:   . Lack of Transportation (Medical): Not on file  . Lack of Transportation (Non-Medical): Not on file  Physical Activity:   . Days of Exercise per Week: Not on file  . Minutes of Exercise per Session: Not on file  Stress:   . Feeling of Stress : Not on file  Social Connections:   . Frequency of Communication with Friends and Family: Not on file  . Frequency of Social Gatherings with Friends and Family: Not on file  . Attends Religious Services: Not on file  . Active Member of Clubs or Organizations: Not on file  . Attends Archivist Meetings: Not on file  . Marital Status: Not on file  Intimate Partner Violence:   . Fear of Current or Ex-Partner: Not on file  . Emotionally Abused: Not on file  . Physically Abused: Not on file  . Sexually Abused: Not on file           Vanessa Kick, MD 06/06/19 [phone]

## 2019-06-05 NOTE — ED Triage Notes (Signed)
Pt states she has had a cough for over 3 weeks.  Pt states she has been using cough meds OTC.

## 2019-06-07 LAB — NOVEL CORONAVIRUS, NAA (HOSP ORDER, SEND-OUT TO REF LAB; TAT 18-24 HRS): SARS-CoV-2, NAA: NOT DETECTED

## 2019-06-29 ENCOUNTER — Ambulatory Visit (INDEPENDENT_AMBULATORY_CARE_PROVIDER_SITE_OTHER): Payer: Medicaid Other

## 2019-06-29 ENCOUNTER — Other Ambulatory Visit: Payer: Self-pay

## 2019-06-29 ENCOUNTER — Ambulatory Visit (HOSPITAL_COMMUNITY)
Admission: EM | Admit: 2019-06-29 | Discharge: 2019-06-29 | Disposition: A | Discharge status: Home / Self Care | Payer: Medicaid Other | Attending: Family Medicine | Admitting: Family Medicine

## 2019-06-29 DIAGNOSIS — Z20822 Contact with and (suspected) exposure to covid-19: Secondary | ICD-10-CM | POA: Insufficient documentation

## 2019-06-29 DIAGNOSIS — R05 Cough: Secondary | ICD-10-CM | POA: Diagnosis present

## 2019-06-29 DIAGNOSIS — Z885 Allergy status to narcotic agent status: Secondary | ICD-10-CM | POA: Diagnosis not present

## 2019-06-29 DIAGNOSIS — J45909 Unspecified asthma, uncomplicated: Secondary | ICD-10-CM | POA: Diagnosis not present

## 2019-06-29 DIAGNOSIS — J22 Unspecified acute lower respiratory infection: Secondary | ICD-10-CM | POA: Diagnosis not present

## 2019-06-29 DIAGNOSIS — R519 Headache, unspecified: Secondary | ICD-10-CM

## 2019-06-29 DIAGNOSIS — Z79899 Other long term (current) drug therapy: Secondary | ICD-10-CM | POA: Insufficient documentation

## 2019-06-29 DIAGNOSIS — R509 Fever, unspecified: Secondary | ICD-10-CM

## 2019-06-29 DIAGNOSIS — Z3202 Encounter for pregnancy test, result negative: Secondary | ICD-10-CM

## 2019-06-29 LAB — POC URINE PREG, ED
Preg Test, Ur: NEGATIVE
Preg Test, Ur: NEGATIVE

## 2019-06-29 LAB — POCT PREGNANCY, URINE: Preg Test, Ur: NEGATIVE

## 2019-06-29 MED ORDER — ACETAMINOPHEN 325 MG PO TABS
650.0000 mg | ORAL_TABLET | Freq: Once | ORAL | Status: AC
  Administered 2019-06-29: 650 mg via ORAL

## 2019-06-29 MED ORDER — ALBUTEROL SULFATE HFA 108 (90 BASE) MCG/ACT IN AERS
2.0000 | INHALATION_SPRAY | Freq: Once | RESPIRATORY_TRACT | Status: AC
  Administered 2019-06-29: 2 via RESPIRATORY_TRACT

## 2019-06-29 MED ORDER — DOXYCYCLINE HYCLATE 100 MG PO CAPS: 100.0000 mg | ORAL_CAPSULE | Freq: Two times a day (BID) | ORAL | 0 refills | Status: AC

## 2019-06-29 MED ORDER — ACETAMINOPHEN 325 MG PO TABS
ORAL_TABLET | ORAL | Status: AC
  Filled 2019-06-29: qty 2

## 2019-06-29 MED ORDER — ALBUTEROL SULFATE HFA 108 (90 BASE) MCG/ACT IN AERS
INHALATION_SPRAY | RESPIRATORY_TRACT | Status: AC
  Filled 2019-06-29: qty 6.7

## 2019-06-29 MED ORDER — PSEUDOEPH-BROMPHEN-DM 30-2-10 MG/5ML PO SYRP: 5.0000 mL | ORAL_SOLUTION | Freq: Four times a day (QID) | ORAL | 0 refills | Status: DC | PRN

## 2019-06-29 NOTE — Discharge Instructions (Signed)
Covid swab pending, monitor my chart for results, quarantine until results return Chest x-ray normal Begin doxycycline twice daily for the next 10 days May use cough syrup as needed for cough/congestion Use albuterol inhaler as needed for shortness of breath, chest tightness Ibuprofen 600-800 mg every 8 hours, Tylenol [phone] mg every 4-6 hours around-the-clock to help control fever Rest and drink plenty of fluids  Please follow-up if symptoms changing, worsening, developing persistent fevers, difficulty breathing, shortness of breath.

## 2019-06-29 NOTE — ED Provider Notes (Signed)
[facility]    CSN: [id number] Arrival date & time: 06/29/19  1552      History   Chief Complaint Chief Complaint  Patient presents with  . Cough    HPI Melissa Ramirez is a 24 y.o. female history of asthma, PCOS, presenting today for evaluation of a cough.  Patient states that she has had a cough for approximately 1 month.  She was seen here at the end of December with negative Covid testing and treated symptomatically along with a course of prednisone for asthma.  Symptoms did improve for a short period of time, but over the past couple weeks her cough is persisted, noticed worsening cough in the past 3 days along with fever beginning today.  Denies known exposure to Covid.  Denies sore throat, nasal congestion.  Cough is productive.  Denies chest pain.  Denies any nausea or vomiting, diarrhea or abdominal pain today.  Denies any dysuria, increased frequency or urgency.  Denies any pelvic symptoms.  HPI  Past Medical History:  Diagnosis Date  . Anxiety   . Asthma   . Obesity   . PCOS (polycystic ovarian syndrome)     Patient Active Problem List   Diagnosis Date Noted  . Obesity 02/21/2014  . PCOS (polycystic ovarian syndrome) 02/21/2014  . Asthma 02/21/2014  . Fibrocystic breast changes of both breasts 02/21/2014    Past Surgical History:  Procedure Laterality Date  . FOOT SURGERY  2009  . TONSILLECTOMY    . tubes in ears      OB History    Gravida  0   Para  0   Term  0   Preterm  0   AB  0   Living  0     SAB  0   TAB  0   Ectopic  0   Multiple  0   Live Births               Home Medications    Prior to Admission medications   Medication Sig Start Date End Date Taking? Authorizing Provider  benzonatate (TESSALON) 100 MG capsule Take 1 capsule by mouth every 8 (eight) hours for cough. 06/05/19   Mardella Layman, MD  bismuth subsalicylate (PEPTO BISMOL) 262 MG/15ML suspension Take 30 mLs by mouth every 6 (six) hours as needed for  diarrhea or loose stools.    [provider]  brompheniramine-pseudoephedrine-DM 30-2-10 MG/5ML syrup Take 5 mLs by mouth 4 (four) times daily as needed. 06/29/19   Johnnie Moten C, PA-C  doxycycline (VIBRAMYCIN) 100 MG capsule Take 1 capsule (100 mg total) by mouth 2 (two) times daily for 10 days. 06/29/19 07/09/19  Jah Alarid C, PA-C  loperamide (IMODIUM A-D) 2 MG tablet Take 4 mg by mouth 4 (four) times daily as needed for diarrhea or loose stools.    [provider]  naproxen (NAPROSYN) 375 MG tablet Take 1 tablet (375 mg total) by mouth 2 (two) times daily. 04/27/18   Kendrick, Caitlyn S, PA-C  predniSONE (STERAPRED UNI-PAK 21 TAB) 10 MG (21) TBPK tablet Take by mouth daily. Take as directed. Patient not taking: Reported on 06/29/2019 06/05/19   Mardella Layman, MD  albuterol (PROVENTIL HFA;VENTOLIN HFA) 108 (90 BASE) MCG/ACT inhaler Inhale 1-2 puffs into the lungs every 6 (six) hours as needed for wheezing or shortness of breath. Patient not taking: Reported on 11/22/2016 08/13/14 06/05/19  Marlon Pel, PA-C  cetirizine (ZYRTEC ALLERGY) 10 MG tablet Take 1 tablet (10  mg total) by mouth daily. Patient not taking: Reported on 11/22/2016 04/03/16 06/05/19  Waynetta Pean, PA-C  fluticasone [facility]) 50 MCG/ACT nasal spray Place 1 spray into both nostrils daily. Patient not taking: Reported on 11/22/2016 04/03/16 06/05/19  Waynetta Pean, PA-C    Family History Family History  Problem Relation Age of Onset  . Hypertension Other   . Diabetes Other     Social History Social History   Tobacco Use  . Smoking status: Never Smoker  . Smokeless tobacco: Never Used  Substance Use Topics  . Alcohol use: No  . Drug use: No     Allergies   Morphine and related and Oxycodone   Review of Systems Review of Systems  Constitutional: Positive for fatigue and fever. Negative for activity change, appetite change and chills.  HENT: Positive for congestion. Negative for ear  pain, rhinorrhea, sinus pressure, sore throat and trouble swallowing.   Eyes: Negative for discharge and redness.  Respiratory: Positive for cough. Negative for chest tightness and shortness of breath.   Cardiovascular: Negative for chest pain.  Gastrointestinal: Negative for abdominal pain, diarrhea, nausea and vomiting.  Musculoskeletal: Negative for myalgias.  Skin: Negative for rash.  Neurological: Negative for dizziness, light-headedness and headaches.     Physical Exam Triage Vital Signs ED Triage Vitals  Enc Vitals Group     BP 06/29/19 1609 (!) 163/93     Pulse Rate 06/29/19 1609 (!) 125     Resp 06/29/19 1609 18     Temp 06/29/19 1609 (!) 100.8 F (38.2 C)     Temp Source 06/29/19 1609 Oral     SpO2 06/29/19 1609 99 %     Weight --      Height --      Head Circumference --      Peak Flow --      Pain Score 06/29/19 1610 5     Pain Loc --      Pain Edu? --      Excl. in [place]? --    No data found.  Updated Vital Signs BP (!) 163/93 (BP Location: Right Arm)   Pulse (!) 125   Temp (!) 100.8 F (38.2 C) (Oral)   Resp 18   LMP 06/01/2019   SpO2 99%   Visual Acuity Right Eye Distance:   Left Eye Distance:   Bilateral Distance:    Right Eye Near:   Left Eye Near:    Bilateral Near:     Physical Exam Vitals and nursing note reviewed.  Constitutional:      General: She is not in acute distress.    Appearance: She is well-developed.  HENT:     Head: Normocephalic and atraumatic.     Ears:     Comments: Bilateral ears without tenderness to palpation of external auricle, tragus and mastoid, EAC's without erythema or swelling, TM's with good bony landmarks and cone of light. Non erythematous.     Mouth/Throat:     Comments: Oral mucosa pink and moist, no tonsillar enlargement or exudate. Posterior pharynx patent and nonerythematous, no uvula deviation or swelling. Normal phonation. Eyes:     Conjunctiva/sclera: Conjunctivae normal.  Cardiovascular:     Rate  and Rhythm: Normal rate and regular rhythm.     Heart sounds: No murmur.  Pulmonary:     Effort: Pulmonary effort is normal. No respiratory distress.     Breath sounds: Normal breath sounds.     Comments: Breathing comfortably at rest, CTABL, no  wheezing, rales or other adventitious sounds auscultated Abdominal:     Palpations: Abdomen is soft.     Tenderness: There is no abdominal tenderness.  Musculoskeletal:     Cervical back: Neck supple.  Skin:    General: Skin is warm and dry.  Neurological:     Mental Status: She is alert.      UC Treatments / Results  Labs (all labs ordered are listed, but only abnormal results are displayed) Labs Reviewed  NOVEL CORONAVIRUS, NAA (HOSP ORDER, SEND-OUT TO REF LAB; TAT 18-24 HRS)  POC SARS CORONAVIRUS 2 AG -  ED  POC URINE PREG, ED  POCT PREGNANCY, URINE  POC URINE PREG, ED    EKG   Radiology DG Chest 2 View  Result Date: 06/29/2019 CLINICAL DATA:  Cough, fever EXAM: CHEST - 2 VIEW COMPARISON:  11/18/2015 FINDINGS: The heart size and mediastinal contours are within normal limits. Both lungs are clear. The visualized skeletal structures are unremarkable. IMPRESSION: No acute abnormality of the lungs. Electronically Signed   By: Lauralyn Primes M.D.   On: 06/29/2019 16:36    Procedures Procedures (including critical care time)  Medications Ordered in UC Medications  albuterol (VENTOLIN HFA) 108 (90 Base) MCG/ACT inhaler 2 puff (has no administration in time range)  acetaminophen (TYLENOL) tablet 650 mg (650 mg Oral Given 06/29/19 1614)    Initial Impression / Assessment and Plan / UC Course  I have reviewed the triage vital signs and the nursing notes.  Pertinent labs & imaging results that were available during my care of the patient were reviewed by me and considered in my medical decision making (see chart for details).  Clinical Course as of Jun 28 1698  Fri Jun 29, 2019  1700 Heart rate rechecked-114   [HW]    Clinical  Course User Index [HW] Zakia Sainato C, PA-C    X-ray negative, Covid point-of-care negative, Covid PCR pending.  Given length of cough we will go ahead and cover for atypicals with doxycycline twice daily x 10 days, providing alternative cough/congestion syrup to use as needed.  Tylenol and ibuprofen for fevers body aches and headache.  Asthma does not seem exacerbated at this time, will provide albuterol refill to use as needed.  Close monitoring of fevers and symptoms, follow-up if developing fevers persisting beyond 4 to 5 days, worsening breathing or cough, chest pain.  No other symptoms suggestive of other source of infection beyond respiratory tract at this time.  Discussed strict return precautions. Patient verbalized understanding and is agreeable with plan.  Final Clinical Impressions(s) / UC Diagnoses   Final diagnoses:  Lower respiratory infection (e.g., bronchitis, pneumonia, pneumonitis, pulmonitis)     Discharge Instructions     Covid swab pending, monitor my chart for results, quarantine until results return Chest x-ray normal Begin doxycycline twice daily for the next 10 days May use cough syrup as needed for cough/congestion Use albuterol inhaler as needed for shortness of breath, chest tightness Ibuprofen 600-800 mg every 8 hours, Tylenol [phone] mg every 4-6 hours around-the-clock to help control fever Rest and drink plenty of fluids  Please follow-up if symptoms changing, worsening, developing persistent fevers, difficulty breathing, shortness of breath.    ED Prescriptions    Medication Sig Dispense Auth. Provider   doxycycline (VIBRAMYCIN) 100 MG capsule Take 1 capsule (100 mg total) by mouth 2 (two) times daily for 10 days. 20 capsule Jonmarc Bodkin C, PA-C   brompheniramine-pseudoephedrine-DM 30-2-10 MG/5ML syrup Take 5 mLs by  mouth 4 (four) times daily as needed. 120 mL Rosena Bartle, Hamlin C, PA-C     PDMP not reviewed this encounter.   Lew Dawes, [place] 06/29/19 1701

## 2019-06-29 NOTE — ED Triage Notes (Signed)
Pt sts cough x 1 month worse over last three days with fever today

## 2019-07-01 LAB — NOVEL CORONAVIRUS, NAA (HOSP ORDER, SEND-OUT TO REF LAB; TAT 18-24 HRS): SARS-CoV-2, NAA: NOT DETECTED

## 2019-09-13 ENCOUNTER — Encounter (HOSPITAL_COMMUNITY): Payer: Self-pay

## 2019-09-13 ENCOUNTER — Observation Stay (HOSPITAL_COMMUNITY)
Admission: EM | Admit: 2019-09-13 | Discharge: 2019-09-14 | Disposition: A | Discharge status: Home / Self Care | Payer: Medicaid Other | Attending: Internal Medicine | Admitting: Internal Medicine

## 2019-09-13 ENCOUNTER — Other Ambulatory Visit: Payer: Self-pay

## 2019-09-13 DIAGNOSIS — E669 Obesity, unspecified: Secondary | ICD-10-CM | POA: Insufficient documentation

## 2019-09-13 DIAGNOSIS — D509 Iron deficiency anemia, unspecified: Principal | ICD-10-CM | POA: Insufficient documentation

## 2019-09-13 DIAGNOSIS — Z885 Allergy status to narcotic agent status: Secondary | ICD-10-CM | POA: Insufficient documentation

## 2019-09-13 DIAGNOSIS — J45909 Unspecified asthma, uncomplicated: Secondary | ICD-10-CM | POA: Insufficient documentation

## 2019-09-13 DIAGNOSIS — D649 Anemia, unspecified: Secondary | ICD-10-CM

## 2019-09-13 DIAGNOSIS — N921 Excessive and frequent menstruation with irregular cycle: Secondary | ICD-10-CM | POA: Insufficient documentation

## 2019-09-13 DIAGNOSIS — Z20822 Contact with and (suspected) exposure to covid-19: Secondary | ICD-10-CM | POA: Insufficient documentation

## 2019-09-13 DIAGNOSIS — Z6841 Body Mass Index (BMI) 40.0 and over, adult: Secondary | ICD-10-CM | POA: Insufficient documentation

## 2019-09-13 LAB — RETICULOCYTES
Immature Retic Fract: 26.3 % — ABNORMAL HIGH (ref 2.3–15.9)
RBC.: 3.87 MIL/uL (ref 3.87–5.11)
Retic Count, Absolute: 58.8 10*3/uL (ref 19.0–186.0)
Retic Ct Pct: 1.5 % (ref 0.4–3.1)

## 2019-09-13 LAB — COMPREHENSIVE METABOLIC PANEL
ALT: 21 U/L (ref 0–44)
AST: 19 U/L (ref 15–41)
Albumin: 4 g/dL (ref 3.5–5.0)
Alkaline Phosphatase: 63 U/L (ref 38–126)
Anion gap: 7 (ref 5–15)
BUN: 10 mg/dL (ref 6–20)
CO2: 25 mmol/L (ref 22–32)
Calcium: 9.2 mg/dL (ref 8.9–10.3)
Chloride: 108 mmol/L (ref 98–111)
Creatinine, Ser: 0.98 mg/dL (ref 0.44–1.00)
GFR calc Af Amer: >60 mL/min (ref >60)
GFR calc non Af Amer: >60 mL/min (ref >60)
Glucose, Bld: 110 mg/dL — ABNORMAL HIGH (ref 70–99)
Potassium: 3.6 mmol/L (ref 3.5–5.1)
Sodium: 140 mmol/L (ref 135–145)
Total Bilirubin: 0.6 mg/dL (ref 0.3–1.2)
Total Protein: 7.6 g/dL (ref 6.5–8.1)

## 2019-09-13 LAB — POC OCCULT BLOOD, ED: Fecal Occult Bld: POSITIVE — AB

## 2019-09-13 LAB — IRON AND TIBC
Iron: 27 ug/dL — ABNORMAL LOW (ref 28–170)
Saturation Ratios: 5 % — ABNORMAL LOW (ref 10.4–31.8)
TIBC: 584 ug/dL — ABNORMAL HIGH (ref 250–450)
UIBC: 557 ug/dL

## 2019-09-13 LAB — CBC
HCT: 24.8 % — ABNORMAL LOW (ref 36.0–46.0)
Hemoglobin: 6.2 g/dL — CL (ref 12.0–15.0)
MCH: 16.2 pg — ABNORMAL LOW (ref 26.0–34.0)
MCHC: 25 g/dL — ABNORMAL LOW (ref 30.0–36.0)
MCV: 64.8 fL — ABNORMAL LOW (ref 80.0–100.0)
Platelets: 421 10*3/uL — ABNORMAL HIGH (ref 150–400)
RBC: 3.83 MIL/uL — ABNORMAL LOW (ref 3.87–5.11)
RDW: 20.2 % — ABNORMAL HIGH (ref 11.5–15.5)
WBC: 10.3 10*3/uL (ref 4.0–10.5)
nRBC: 0.9 % — ABNORMAL HIGH (ref 0.0–0.2)

## 2019-09-13 LAB — I-STAT BETA HCG BLOOD, ED (MC, WL, AP ONLY): I-stat hCG, quantitative: <5 m[IU]/mL (ref <5)

## 2019-09-13 LAB — FOLATE: Folate: 7.2 ng/mL (ref >5.9)

## 2019-09-13 LAB — FERRITIN: Ferritin: 6 ng/mL — ABNORMAL LOW (ref 11–307)

## 2019-09-13 LAB — VITAMIN B12: Vitamin B-12: 327 pg/mL (ref 180–914)

## 2019-09-13 MED ORDER — SODIUM CHLORIDE 0.9 % IV SOLN: 10.0000 mL/h | Freq: Once | INTRAVENOUS | Status: DC

## 2019-09-13 NOTE — ED Provider Notes (Signed)
[facility]-EMERGENCY DEPT Provider Note   CSN: [id number] Arrival date & time: 09/13/19  1850     History Chief Complaint  Patient presents with  . Abnormal Lab    Melissa Ramirez is a 24 y.o. female.  HPI 24 year old female presents with anemia.  Sent in by her doctor due to a hemoglobin of about 6.8.  Patient states she has heavy long menstrual cycles that are irregular.  She is on her cycle now though her last cycle ended in December.  Otherwise she denies any other sources of possible bleeding such as rectal bleeding.  She feels tired whenever she is on her cycle like now but otherwise has not noticed particular fatigue.   Past Medical History:  Diagnosis Date  . Anxiety   . Asthma   . Obesity   . PCOS (polycystic ovarian syndrome)     Patient Active Problem List   Diagnosis Date Noted  . Symptomatic anemia 09/13/2019  . Obesity 02/21/2014  . PCOS (polycystic ovarian syndrome) 02/21/2014  . Asthma 02/21/2014  . Fibrocystic breast changes of both breasts 02/21/2014    Past Surgical History:  Procedure Laterality Date  . FOOT SURGERY  2009  . TONSILLECTOMY    . tubes in ears       OB History    Gravida  0   Para  0   Term  0   Preterm  0   AB  0   Living  0     SAB  0   TAB  0   Ectopic  0   Multiple  0   Live Births              Family History  Problem Relation Age of Onset  . Hypertension Other   . Diabetes Other     Social History   Tobacco Use  . Smoking status: Never Smoker  . Smokeless tobacco: Never Used  Substance Use Topics  . Alcohol use: No  . Drug use: No    Home Medications Prior to Admission medications   Medication Sig Start Date End Date Taking? Authorizing Provider  albuterol (PROVENTIL HFA;VENTOLIN HFA) 108 (90 BASE) MCG/ACT inhaler Inhale 1-2 puffs into the lungs every 6 (six) hours as needed for wheezing or shortness of breath. Patient not taking: Reported on 11/22/2016 08/13/14 06/05/19   Marlon Pel, PA-C  cetirizine (ZYRTEC ALLERGY) 10 MG tablet Take 1 tablet (10 mg total) by mouth daily. Patient not taking: Reported on 11/22/2016 04/03/16 06/05/19  Everlene Farrier, PA-C  fluticasone [facility]) 50 MCG/ACT nasal spray Place 1 spray into both nostrils daily. Patient not taking: Reported on 11/22/2016 04/03/16 06/05/19  Everlene Farrier, PA-C    Allergies    Morphine and related and Oxycodone  Review of Systems   Review of Systems  Constitutional: Positive for fatigue.  Gastrointestinal: Negative for abdominal pain and blood in stool.  Genitourinary: Positive for vaginal bleeding.  All other systems reviewed and are negative.   Physical Exam Updated Vital Signs BP 139/69 (BP Location: Left Arm)   Pulse (!) 112   Temp 98.9 F (37.2 C) (Oral)   Resp 17   Ht 5\' 6"  (1.676 m)   Wt (!) 138.3 kg   LMP 09/13/2019   SpO2 99%   BMI 49.23 kg/m   Physical Exam Vitals and nursing note reviewed.  Constitutional:      Appearance: She is well-developed. She is obese.  HENT:     Head: Normocephalic  and atraumatic.     Right Ear: External ear normal.     Left Ear: External ear normal.     Nose: Nose normal.  Eyes:     General:        Right eye: No discharge.        Left eye: No discharge.  Cardiovascular:     Rate and Rhythm: Normal rate and regular rhythm.     Heart sounds: Normal heart sounds.  Pulmonary:     Effort: Pulmonary effort is normal.     Breath sounds: Normal breath sounds.  Abdominal:     Palpations: Abdomen is soft.     Tenderness: There is no abdominal tenderness.  Genitourinary:    Comments: Minimal light red on digital rectal exam. No obvious hemorrhoid/mass Skin:    General: Skin is warm and dry.  Neurological:     Mental Status: She is alert.  Psychiatric:        Mood and Affect: Mood is not anxious.     ED Results / Procedures / Treatments   Labs (all labs ordered are listed, but only abnormal results are displayed) Labs Reviewed    CBC - Abnormal; Notable for the following components:      Result Value   RBC 3.83 (*)    Hemoglobin 6.2 (*)    HCT 24.8 (*)    MCV 64.8 (*)    MCH 16.2 (*)    MCHC 25.0 (*)    RDW 20.2 (*)    Platelets 421 (*)    nRBC 0.9 (*)    All other components within normal limits  COMPREHENSIVE METABOLIC PANEL - Abnormal; Notable for the following components:   Glucose, Bld 110 (*)    All other components within normal limits  IRON AND TIBC - Abnormal; Notable for the following components:   Iron 27 (*)    TIBC 584 (*)    Saturation Ratios 5 (*)    All other components within normal limits  FERRITIN - Abnormal; Notable for the following components:   Ferritin 6 (*)    All other components within normal limits  RETICULOCYTES - Abnormal; Notable for the following components:   Immature Retic Fract 26.3 (*)    All other components within normal limits  POC OCCULT BLOOD, ED - Abnormal; Notable for the following components:   Fecal Occult Bld POSITIVE (*)    All other components within normal limits  SARS CORONAVIRUS 2 (TAT 6-24 HRS)  VITAMIN B12  FOLATE  HIV ANTIBODY (ROUTINE TESTING W REFLEX)  BASIC METABOLIC PANEL  OCCULT BLOOD X 1 CARD TO LAB, STOOL  I-STAT BETA HCG BLOOD, ED ([facility], [facility], [facility] ONLY)  PREPARE RBC (CROSSMATCH)  TYPE AND SCREEN  ABO/RH    EKG None  Radiology No results found.  Procedures .Critical Care Performed by: Sherwood Gambler, MD Authorized by: Sherwood Gambler, MD   Critical care provider statement:    Critical care time (minutes):  30   Critical care time was exclusive of:  Separately billable procedures and treating other patients   Critical care was necessary to treat or prevent imminent or life-threatening deterioration of the following conditions:  Circulatory failure   Critical care was time spent personally by me on the following activities:  Discussions with consultants, evaluation of patient's response to treatment, examination of patient,  ordering and performing treatments and interventions, ordering and review of laboratory studies, ordering and review of radiographic studies, pulse oximetry, re-evaluation of patient's condition, obtaining history  from patient or surrogate and review of old charts   (including critical care time)  Medications Ordered in ED Medications  0.9 %  sodium chloride infusion (has no administration in time range)  sodium chloride flush (NS) 0.9 % injection 3 mL (has no administration in time range)  sodium chloride flush (NS) 0.9 % injection 3 mL (has no administration in time range)  sodium chloride flush (NS) 0.9 % injection 3 mL (has no administration in time range)  0.9 %  sodium chloride infusion (has no administration in time range)  acetaminophen (TYLENOL) tablet 650 mg (has no administration in time range)    Or  acetaminophen (TYLENOL) suppository 650 mg (has no administration in time range)  ondansetron (ZOFRAN) tablet 4 mg (has no administration in time range)    Or  ondansetron (ZOFRAN) injection 4 mg (has no administration in time range)    ED Course  I have reviewed the triage vital signs and the nursing notes.  Pertinent labs & imaging results that were available during my care of the patient were reviewed by me and considered in my medical decision making (see chart for details).    MDM Rules/Calculators/A&P                      Patient with symptomatic anemia of 6.2.  Patient is mildly tachycardic but otherwise stable.  Probably this is from her heavy vaginal bleeding.  Technically her Hemoccult is positive but she also has a large amount of blood in her diaper from vaginal bleeding.  Will need admission, observation and blood transfusion.  Dr. Antionette Char to admit. Final Clinical Impression(s) / ED Diagnoses Final diagnoses:  Symptomatic anemia    Rx / DC Orders ED Discharge Orders    None       Pricilla Loveless, MD 09/14/19 0025

## 2019-09-13 NOTE — H&P (Signed)
History and Physical    NAMRATA DANGLER WGN:[id number] DOB: June 20, 1995 DOA: 09/13/2019  PCP: [facility], [facility]   Patient coming from: Home   Chief Complaint: Low Hgb, fatigue, DOE   HPI: Melissa Ramirez is a 24 y.o. female with medical history significant for BMI 49 who presents for evaluation of low hemoglobin on outpatient blood work.  The patient reports that she suffers from heavy menstrual bleeding, will often go months without a period, but then have a very heavy and prolonged menstrual period.  She is currently menstruating and notes that it often lasts a month.  She has recently become dyspneic with exertion and more fatigued than usual which she describes as typical for her during her menstrual periods.  She denies any abdominal pain, nausea, vomiting, melena, or hematochezia.  She uses over-the-counter analgesics roughly once a month, took 1 last week for some leg pain, but is not sure what the medication was.  She denies any lightheadedness or chest pain. She notes that her PCP recommended OCP for he menorrhagia but she has not yet started.   ED Course: Upon arrival to the ED, patient is found to be afebrile, saturating well on room air, tachycardic to 112, and with stable blood pressure.  Chemistry panel is unremarkable, CBC notable for microcytic anemia with hemoglobin 6.2, and fecal occult blood testing was positive.  1 unit of RBCs were ordered for immediate transfusion, anemia panel was sent, and Covid PCR screening test also ordered.  Review of Systems:  All other systems reviewed and apart from HPI, are negative.  Past Medical History:  Diagnosis Date  . Anxiety   . Asthma   . Obesity   . PCOS (polycystic ovarian syndrome)     Past Surgical History:  Procedure Laterality Date  . FOOT SURGERY  2009  . TONSILLECTOMY    . tubes in ears       reports that she has never smoked. She has never used smokeless tobacco. She reports that she does not drink alcohol  or use drugs.  Allergies  Allergen Reactions  . Morphine And Related Itching  . Oxycodone Itching    Family History  Problem Relation Age of Onset  . Hypertension Other   . Diabetes Other      Prior to Admission medications   Medication Sig Start Date End Date Taking? Authorizing Provider  albuterol (PROVENTIL HFA;VENTOLIN HFA) 108 (90 BASE) MCG/ACT inhaler Inhale 1-2 puffs into the lungs every 6 (six) hours as needed for wheezing or shortness of breath. Patient not taking: Reported on 11/22/2016 08/13/14 06/05/19  Marlon Pel, PA-C  cetirizine (ZYRTEC ALLERGY) 10 MG tablet Take 1 tablet (10 mg total) by mouth daily. Patient not taking: Reported on 11/22/2016 04/03/16 06/05/19  Everlene Farrier, PA-C  fluticasone [facility]) 50 MCG/ACT nasal spray Place 1 spray into both nostrils daily. Patient not taking: Reported on 11/22/2016 04/03/16 06/05/19  Everlene Farrier, PA-C    Physical Exam: Vitals:   09/13/19 1919  BP: 139/69  Pulse: (!) 112  Resp: 17  Temp: 98.9 F (37.2 C)  TempSrc: Oral  SpO2: 99%  Weight: (!) 138.3 kg  Height: 5\' 6"  (1.676 m)     Constitutional: NAD, calm  Eyes: PERTLA, lids and conjunctivae normal ENMT: Mucous membranes are moist. Posterior pharynx clear of any exudate or lesions.   Neck: normal, supple, no masses, no thyromegaly Respiratory:  no wheezing, no crackles. No accessory muscle use.  Cardiovascular: S1 & S2 heard, regular  rate and rhythm. No extremity edema.   Abdomen: No distension, no tenderness, soft. Bowel sounds active.  Musculoskeletal: no clubbing / cyanosis. No joint deformity upper and lower extremities.   Skin: no significant rashes, lesions, ulcers. Warm, dry, well-perfused. Neurologic: no facial asymmetry. Sensation intact. Moving all extremities.   Psychiatric: Alert and oriented x 3. Pleasant and cooperative.    Labs and Imaging on Admission: I have personally reviewed following labs and imaging studies  CBC: Recent Labs    Lab 09-16-2019 1935  WBC 10.3  HGB 6.2*  HCT 24.8*  MCV 64.8*  PLT 626*   Basic Metabolic Panel: Recent Labs  Lab 09-16-19 1935  NA 140  K 3.6  CL 108  CO2 25  GLUCOSE 110*  BUN 10  CREATININE 0.98  CALCIUM 9.2   GFR: Estimated Creatinine Clearance: 129.2 mL/min (by C-G formula based on SCr of 0.98 mg/dL). Liver Function Tests: Recent Labs  Lab 2019-09-16 1935  AST 19  ALT 21  ALKPHOS 63  BILITOT 0.6  PROT 7.6  ALBUMIN 4.0   No results for input(s): LIPASE, AMYLASE in the last 168 hours. No results for input(s): AMMONIA in the last 168 hours. Coagulation Profile: No results for input(s): INR, PROTIME in the last 168 hours. Cardiac Enzymes: No results for input(s): CKTOTAL, CKMB, CKMBINDEX, TROPONINI in the last 168 hours. BNP (last 3 results) No results for input(s): PROBNP in the last 8760 hours. HbA1C: No results for input(s): HGBA1C in the last 72 hours. CBG: No results for input(s): GLUCAP in the last 168 hours. Lipid Profile: No results for input(s): CHOL, HDL, LDLCALC, TRIG, CHOLHDL, LDLDIRECT in the last 72 hours. Thyroid Function Tests: No results for input(s): TSH, T4TOTAL, FREET4, T3FREE, THYROIDAB in the last 72 hours. Anemia Panel: Recent Labs    2019/09/16 2144  RETICCTPCT 1.5   Urine analysis:    Component Value Date/Time   COLORURINE YELLOW 11/22/2016 0110   APPEARANCEUR CLEAR 11/22/2016 0110   LABSPEC 1.025 11/22/2016 0110   PHURINE 6.5 11/22/2016 0110   GLUCOSEU NEGATIVE 11/22/2016 0110   HGBUR NEGATIVE 11/22/2016 0110   BILIRUBINUR NEGATIVE 11/22/2016 0110   KETONESUR NEGATIVE 11/22/2016 0110   PROTEINUR NEGATIVE 11/22/2016 0110   UROBILINOGEN 0.2 11/12/2011 2153   NITRITE NEGATIVE 11/22/2016 0110   LEUKOCYTESUR NEGATIVE 11/22/2016 0110   Sepsis Labs: @LABRCNTIP (procalcitonin:4,lacticidven:4) )No results found for this or any previous visit (from the past 240 hour(s)).   Radiological Exams on Admission: No results  found.   Assessment/Plan   1. Symptomatic anemia  - Presents for evaluation of severe anemia on outpatient blood work, found to have Hgb 6.2 with mild tachycardia and stable BP, reports heavy and active menstrual bleeding, denies abdominal pain, melena, or hematochezia but is found to have positive FOBT  - Anemia panel and 1 unit of RBC was ordered from ED  - Check post-transfusion CBC, repeat FOBT in am    DVT prophylaxis: SCDs Code Status: Full  Family Communication: Discussed with patient  Disposition Plan: Likely home in 24-48 hours if appropriate rise in Hgb with transfusion and no active bleeding  Consults called: none  Admission status: Observation     Vianne Bulls, MD [facility] Pager: See [email or web address]  If 7AM-7PM, please contact the daytime attending [email or web address]  09-16-19, 10:23 PM

## 2019-09-13 NOTE — ED Triage Notes (Signed)
Seen PCP for physical. They called back with low hgb. 6.8.

## 2019-09-14 DIAGNOSIS — D649 Anemia, unspecified: Secondary | ICD-10-CM

## 2019-09-14 LAB — BASIC METABOLIC PANEL
Anion gap: 13 (ref 5–15)
BUN: 9 mg/dL (ref 6–20)
CO2: 25 mmol/L (ref 22–32)
Calcium: 9.1 mg/dL (ref 8.9–10.3)
Chloride: 103 mmol/L (ref 98–111)
Creatinine, Ser: 0.9 mg/dL (ref 0.44–1.00)
GFR calc Af Amer: >60 mL/min (ref >60)
GFR calc non Af Amer: >60 mL/min (ref >60)
Glucose, Bld: 107 mg/dL — ABNORMAL HIGH (ref 70–99)
Potassium: 4 mmol/L (ref 3.5–5.1)
Sodium: 141 mmol/L (ref 135–145)

## 2019-09-14 LAB — HEMOGLOBIN AND HEMATOCRIT, BLOOD
HCT: 28.5 % — ABNORMAL LOW (ref 36.0–46.0)
Hemoglobin: 7.7 g/dL — ABNORMAL LOW (ref 12.0–15.0)

## 2019-09-14 LAB — HIV ANTIBODY (ROUTINE TESTING W REFLEX): HIV Screen 4th Generation wRfx: NONREACTIVE

## 2019-09-14 LAB — SARS CORONAVIRUS 2 (TAT 6-24 HRS): SARS Coronavirus 2: NEGATIVE

## 2019-09-14 LAB — PREPARE RBC (CROSSMATCH)

## 2019-09-14 LAB — ABO/RH: ABO/RH(D): A POS

## 2019-09-14 MED ORDER — ONDANSETRON HCL 4 MG/2ML IJ SOLN: 4.0000 mg | Freq: Four times a day (QID) | INTRAMUSCULAR | Status: DC | PRN

## 2019-09-14 MED ORDER — SODIUM CHLORIDE 0.9% FLUSH: 3.0000 mL | INTRAVENOUS | Status: DC | PRN

## 2019-09-14 MED ORDER — SODIUM CHLORIDE 0.9% FLUSH: 3.0000 mL | Freq: Two times a day (BID) | INTRAVENOUS | Status: DC

## 2019-09-14 MED ORDER — SODIUM CHLORIDE 0.9 % IV SOLN: 250.0000 mL | INTRAVENOUS | Status: DC | PRN

## 2019-09-14 MED ORDER — ACETAMINOPHEN 650 MG RE SUPP: 650.0000 mg | Freq: Four times a day (QID) | RECTAL | Status: DC | PRN

## 2019-09-14 MED ORDER — ONDANSETRON HCL 4 MG PO TABS: 4.0000 mg | ORAL_TABLET | Freq: Four times a day (QID) | ORAL | Status: DC | PRN

## 2019-09-14 MED ORDER — ACETAMINOPHEN 325 MG PO TABS: 650.0000 mg | ORAL_TABLET | Freq: Four times a day (QID) | ORAL | Status: DC | PRN

## 2019-09-14 NOTE — ED Notes (Signed)
Report called for pt transfer to the floor  

## 2019-09-14 NOTE — Discharge Summary (Signed)
Physician Discharge Summary  [name] OVF:[id number] DOB: May 14, 1996 DOA: 09/13/2019  PCP: [facility], [facility]  Admit date: 09/13/2019 Discharge date: 09/14/2019  Admitted From: Home Disposition: Home  Recommendations for Outpatient Follow-up:  1. Follow up with PCP in 1-2 weeks 2. Please obtain BMP/CBC in one week  Discharge Condition: Stable CODE STATUS: Full Diet recommendation: As tolerated  Brief/Interim Summary: Melissa Ramirez is a 24 y.o. female with medical history significant for PCOS, menometrorrhagia, obesity with BMI 49 who presents for evaluation of low hemoglobin on outpatient blood work.  The patient reports that she suffers from heavy menstrual bleeding, will often go months without a period, but then have a very heavy and prolonged menstrual period.  She is currently menstruating and notes that it often lasts a month.  She has recently become dyspneic with exertion and more fatigued than usual which she describes as typical for her during her menstrual periods.  She denies any abdominal pain, nausea, vomiting, melena, or hematochezia.  She uses over-the-counter analgesics roughly once a month, took 1 last week for some leg pain, but is not sure what the medication was.  She denies any lightheadedness or chest pain. She notes that her PCP recommended OCP for meno/metrorrhagia but she has not yet started. Upon arrival to the ED, patient is found to be afebrile, saturating well on room air, tachycardic to 112, and with stable blood pressure.  Chemistry panel is unremarkable, CBC notable for microcytic anemia with hemoglobin 6.2, and fecal occult blood testing was positive.  1 unit of RBCs were ordered for immediate transfusion  Patient admitted as above with acute anemia, likely in the setting of irregular heavy menstrual bleeding.  Patient also likely has concurrent iron deficiency anemia exacerbating her baseline anemia.  Patient is now status post transfusion,  tolerated well and otherwise feels at baseline.  She did not have symptoms at admission and continues to feel quite well, repeat H&H 7.7, given patient remains asymptomatic, ambulating without dyspnea or hypoxia or any orthostatic symptoms she is stable for discharge. Patient will need close follow-up with OB/GYN who is her PCP for further evaluation and treatment as above for PCOS and abnormal bleeding as well as repeat labs to ensure stable hemoglobin.  Discharge Diagnoses:  Principal Problem:   Symptomatic anemia    Discharge Instructions  Discharge Instructions    Call MD for:   Complete by: As directed    Set up appointment with Gyn early next week for repeat labs and to discuss treatment for PCOS (as we discussed today).   Call MD for:  difficulty breathing, headache or visual disturbances   Complete by: As directed    Call MD for:  extreme fatigue   Complete by: As directed    Call MD for:  hives   Complete by: As directed    Call MD for:  persistant dizziness or light-headedness   Complete by: As directed    Call MD for:  persistant nausea and vomiting   Complete by: As directed    Call MD for:  severe uncontrolled pain   Complete by: As directed    Call MD for:  temperature >100.4   Complete by: As directed    Diet - low sodium heart healthy   Complete by: As directed    Increase activity slowly   Complete by: As directed      Allergies as of 09/14/2019      Reactions   Morphine And Related  Itching   Oxycodone Itching      Medication List    You have not been prescribed any medications.     Allergies  Allergen Reactions  . Morphine And Related Itching  . Oxycodone Itching    Consultations: None  Procedures/Studies: No results found.  Subjective: No acute issues or events overnight, feels quite well denies chest pain, shortness of breath, nausea, vomiting, diarrhea, constipation, headache, fevers, chills.   Discharge Exam: Vitals:   09/14/19 1130  09/14/19 1345  BP:  103/72  Pulse: 81 80  Resp:  (!) 22  Temp: 98.4 F (36.9 C) 98.2 F (36.8 C)  SpO2: 100% 100%   Vitals:   09/14/19 0734 09/14/19 1100 09/14/19 1130 09/14/19 1345  BP: 116/80 96/62  103/72  Pulse: 87 89 81 80  Resp: 20 20  (!) 22  Temp: 98.5 F (36.9 C) 98.4 F (36.9 C) 98.4 F (36.9 C) 98.2 F (36.8 C)  TempSrc: Oral Oral Oral Oral  SpO2: 96% 100% 100% 100%  Weight:      Height:        General:  Pleasantly resting in bed, No acute distress. HEENT:  Normocephalic atraumatic.  Sclerae nonicteric, noninjected.  Extraocular movements intact bilaterally. Neck:  Without mass or deformity.  Trachea is midline. Lungs:  Clear to auscultate bilaterally without rhonchi, wheeze, or rales. Heart:  Regular rate and rhythm.  Without murmurs, rubs, or gallops. Abdomen:  Soft, nontender, nondistended.  Without guarding or rebound. Extremities: Without cyanosis, clubbing, edema, or obvious deformity. Vascular:  Dorsalis pedis and posterior tibial pulses palpable bilaterally. Skin:  Warm and dry, no erythema, no ulcerations.   The results of significant diagnostics from this hospitalization (including imaging, microbiology, ancillary and laboratory) are listed below for reference.     Microbiology: Recent Results (from the past 240 hour(s))  SARS CORONAVIRUS 2 (TAT 6-24 HRS) Nasopharyngeal Nasopharyngeal Swab     Status: None   Collection Time: 09/14/19 12:42 AM   Specimen: Nasopharyngeal Swab  Result Value Ref Range Status   SARS Coronavirus 2 NEGATIVE NEGATIVE Final    Comment: (NOTE) SARS-CoV-2 target nucleic acids are NOT DETECTED. The SARS-CoV-2 RNA is generally detectable in upper and lower respiratory specimens during the acute phase of infection. Negative results do not preclude SARS-CoV-2 infection, do not rule out co-infections with other pathogens, and should not be used as the sole basis for treatment or other patient management decisions. Negative  results must be combined with clinical observations, patient history, and epidemiological information. The expected result is Negative. Fact Sheet for Patients: HairSlick.no Fact Sheet for Healthcare Providers: quierodirigir.com This test is not yet approved or cleared by the [place] FDA and  has been authorized for detection and/or diagnosis of SARS-CoV-2 by FDA under an Emergency Use Authorization (EUA). This EUA will remain  in effect (meaning this test can be used) for the duration of the COVID-19 declaration under Section 56 4(b)(1) of the Act, 21 U.S.C. section 360bbb-3(b)(1), unless the authorization is terminated or revoked sooner. Performed at [facility], [address] [place], [place] [address]      Labs: BNP (last 3 results) No results for input(s): BNP in the last 8760 hours. Basic Metabolic Panel: Recent Labs  Lab 09/13/19 1935 09/14/19 0604  NA 140 141  K 3.6 4.0  CL 108 103  CO2 25 25  GLUCOSE 110* 107*  BUN 10 9  CREATININE 0.98 0.90  CALCIUM 9.2 9.1   Liver Function Tests:  Recent Labs  Lab 09/13/19 1935  AST 19  ALT 21  ALKPHOS 63  BILITOT 0.6  PROT 7.6  ALBUMIN 4.0   No results for input(s): LIPASE, AMYLASE in the last 168 hours. No results for input(s): AMMONIA in the last 168 hours. CBC: Recent Labs  Lab 09/13/19 1935 09/14/19 1322  WBC 10.3  --   HGB 6.2* 7.7*  HCT 24.8* 28.5*  MCV 64.8*  --   PLT 421*  --    Cardiac Enzymes: No results for input(s): CKTOTAL, CKMB, CKMBINDEX, TROPONINI in the last 168 hours. BNP: Invalid input(s): POCBNP CBG: No results for input(s): GLUCAP in the last 168 hours. D-Dimer No results for input(s): DDIMER in the last 72 hours. Hgb A1c No results for input(s): HGBA1C in the last 72 hours. Lipid Profile No results for input(s): CHOL, HDL, LDLCALC, TRIG, CHOLHDL, LDLDIRECT in the last 72 hours. Thyroid function studies No  results for input(s): TSH, T4TOTAL, T3FREE, THYROIDAB in the last 72 hours.  Invalid input(s): FREET3 Anemia work up Recent Labs    09/13/19 2144  VITAMINB12 327  FOLATE 7.2  FERRITIN 6*  TIBC 584*  IRON 27*  RETICCTPCT 1.5   Urinalysis    Component Value Date/Time   COLORURINE YELLOW 11/22/2016 0110   APPEARANCEUR CLEAR 11/22/2016 0110   LABSPEC 1.025 11/22/2016 0110   PHURINE 6.5 11/22/2016 0110   GLUCOSEU NEGATIVE 11/22/2016 0110   HGBUR NEGATIVE 11/22/2016 0110   BILIRUBINUR NEGATIVE 11/22/2016 0110   KETONESUR NEGATIVE 11/22/2016 0110   PROTEINUR NEGATIVE 11/22/2016 0110   UROBILINOGEN 0.2 11/12/2011 2153   NITRITE NEGATIVE 11/22/2016 0110   LEUKOCYTESUR NEGATIVE 11/22/2016 0110   Sepsis Labs Invalid input(s): PROCALCITONIN,  WBC,  LACTICIDVEN Microbiology Recent Results (from the past 240 hour(s))  SARS CORONAVIRUS 2 (TAT 6-24 HRS) Nasopharyngeal Nasopharyngeal Swab     Status: None   Collection Time: 09/14/19 12:42 AM   Specimen: Nasopharyngeal Swab  Result Value Ref Range Status   SARS Coronavirus 2 NEGATIVE NEGATIVE Final    Comment: (NOTE) SARS-CoV-2 target nucleic acids are NOT DETECTED. The SARS-CoV-2 RNA is generally detectable in upper and lower respiratory specimens during the acute phase of infection. Negative results do not preclude SARS-CoV-2 infection, do not rule out co-infections with other pathogens, and should not be used as the sole basis for treatment or other patient management decisions. Negative results must be combined with clinical observations, patient history, and epidemiological information. The expected result is Negative. Fact Sheet for Patients: HairSlick.no Fact Sheet for Healthcare Providers: quierodirigir.com This test is not yet approved or cleared by the [place] FDA and  has been authorized for detection and/or diagnosis of SARS-CoV-2 by FDA under an Emergency Use  Authorization (EUA). This EUA will remain  in effect (meaning this test can be used) for the duration of the COVID-19 declaration under Section 56 4(b)(1) of the Act, 21 U.S.C. section 360bbb-3(b)(1), unless the authorization is terminated or revoked sooner. Performed at [facility], [address] [place], [place] [address]      Time coordinating discharge: Over 30 minutes  SIGNED:   Azucena Fallen, DO [facility] 09/14/2019, 2:18 PM Pager   If 7PM-7AM, please contact night-coverage [email or web address]

## 2019-09-15 LAB — BPAM RBC
Blood Product Expiration Date: 202104232359
ISSUE DATE / TIME: 202104090727
Unit Type and Rh: 6200

## 2019-09-15 LAB — TYPE AND SCREEN
ABO/RH(D): A POS
Antibody Screen: NEGATIVE
Unit division: 0

## 2019-09-20 ENCOUNTER — Emergency Department (HOSPITAL_COMMUNITY)
Admission: EM | Admit: 2019-09-20 | Discharge: 2019-09-20 | Disposition: A | Discharge status: Home / Self Care | Payer: Medicaid Other | Attending: Emergency Medicine | Admitting: Emergency Medicine

## 2019-09-20 ENCOUNTER — Encounter (HOSPITAL_COMMUNITY): Payer: Self-pay

## 2019-09-20 ENCOUNTER — Other Ambulatory Visit: Payer: Self-pay

## 2019-09-20 DIAGNOSIS — D649 Anemia, unspecified: Secondary | ICD-10-CM | POA: Insufficient documentation

## 2019-09-20 DIAGNOSIS — J45909 Unspecified asthma, uncomplicated: Secondary | ICD-10-CM | POA: Insufficient documentation

## 2019-09-20 DIAGNOSIS — N921 Excessive and frequent menstruation with irregular cycle: Secondary | ICD-10-CM

## 2019-09-20 DIAGNOSIS — Z79899 Other long term (current) drug therapy: Secondary | ICD-10-CM | POA: Insufficient documentation

## 2019-09-20 LAB — BASIC METABOLIC PANEL
Anion gap: 9 (ref 5–15)
BUN: 13 mg/dL (ref 6–20)
CO2: 24 mmol/L (ref 22–32)
Calcium: 9.3 mg/dL (ref 8.9–10.3)
Chloride: 105 mmol/L (ref 98–111)
Creatinine, Ser: 0.84 mg/dL (ref 0.44–1.00)
GFR calc Af Amer: >60 mL/min (ref >60)
GFR calc non Af Amer: >60 mL/min (ref >60)
Glucose, Bld: 108 mg/dL — ABNORMAL HIGH (ref 70–99)
Potassium: 3.7 mmol/L (ref 3.5–5.1)
Sodium: 138 mmol/L (ref 135–145)

## 2019-09-20 LAB — CBC
HCT: 27.4 % — ABNORMAL LOW (ref 36.0–46.0)
Hemoglobin: 7.3 g/dL — ABNORMAL LOW (ref 12.0–15.0)
MCH: 17.8 pg — ABNORMAL LOW (ref 26.0–34.0)
MCHC: 26.6 g/dL — ABNORMAL LOW (ref 30.0–36.0)
MCV: 67 fL — ABNORMAL LOW (ref 80.0–100.0)
Platelets: 466 10*3/uL — ABNORMAL HIGH (ref 150–400)
RBC: 4.09 MIL/uL (ref 3.87–5.11)
RDW: 24.1 % — ABNORMAL HIGH (ref 11.5–15.5)
WBC: 9.6 10*3/uL (ref 4.0–10.5)
nRBC: 0.5 % — ABNORMAL HIGH (ref 0.0–0.2)

## 2019-09-20 LAB — POC URINE PREG, ED: Preg Test, Ur: NEGATIVE

## 2019-09-20 MED ORDER — FERROUS SULFATE 325 (65 FE) MG PO TABS: 325.0000 mg | ORAL_TABLET | Freq: Every day | ORAL | 0 refills | Status: DC

## 2019-09-20 MED ORDER — MEGESTROL ACETATE 40 MG PO TABS: 40.0000 mg | ORAL_TABLET | Freq: Two times a day (BID) | ORAL | 0 refills | Status: AC

## 2019-09-20 NOTE — ED Triage Notes (Signed)
Patient reports being admitted here last week. Patient reports having a blood transfusion. Patient was told to follow up after discharge to check her labs.   Patient reports she tried to follow up with her PCP and no appointment until end of may.   Patient just wants her blood work tested.     Patient reports she is on her menstrual cycle but feels good today. No other complaints   A/Ox4 Ambulatory in triage.

## 2019-09-20 NOTE — Discharge Instructions (Addendum)
As discussed, I spoke to [facility] and they will be reaching out to you to schedule an appointment. If you do not hear from them in the next few days, call the number in your discharge papers to schedule an appointment. I am sending you home with a medication to stop your menstrual cycle. Take 40mg  twice a day for SEVEN days then take 40mg  DAILY until your scheduled appointment. Return to the ER if you develop shortness of breath, chest pain, lightheadedness, or anything concerning

## 2019-09-20 NOTE — ED Provider Notes (Signed)
[facility] DEPT Provider Note   CSN: [id number] Arrival date & time: 09/20/19  1411     History Chief Complaint  Patient presents with  . Lab Check    Melissa Ramirez is a 24 y.o. female with a past medical history significant for anxiety, asthma, obesity, and PCOS who presents to the ED for lab work.  Patient was admitted to the hospital on 4/8-4/9 due to acute anemia secondary to irregular menstrual bleeding.  Patient admits to irregular menstrual cycles.  She started her menstrual cycle on 4/1 and has continued to have bleeding since.  She notes this is very common for her.  She notes her menstrual cycles have always been heavy given her diagnosis of PCOS. She notes she is filling a maxi pad every hour. Denies clots. She is not currently on any birth control.  Patient denies chest pain, shortness of breath, lightheadedness, and other physical complaints. She was unable to get an appointment for lab work with her PCP until the end of May which is why she came to the ED. She does not have an OBGYN. No alleviating or aggravating factors.   History obtained from patient and past medical records. No interpreter used during encounter.      Past Medical History:  Diagnosis Date  . Anxiety   . Asthma   . Obesity   . PCOS (polycystic ovarian syndrome)     Patient Active Problem List   Diagnosis Date Noted  . Symptomatic anemia 09/13/2019  . Obesity 02/21/2014  . PCOS (polycystic ovarian syndrome) 02/21/2014  . Asthma 02/21/2014  . Fibrocystic breast changes of both breasts 02/21/2014    Past Surgical History:  Procedure Laterality Date  . FOOT SURGERY  2009  . TONSILLECTOMY    . tubes in ears       OB History    Gravida  0   Para  0   Term  0   Preterm  0   AB  0   Living  0     SAB  0   TAB  0   Ectopic  0   Multiple  0   Live Births              Family History  Problem Relation Age of Onset  . Hypertension Other   .  Diabetes Other     Social History   Tobacco Use  . Smoking status: Never Smoker  . Smokeless tobacco: Never Used  Substance Use Topics  . Alcohol use: No  . Drug use: No    Home Medications Prior to Admission medications   Medication Sig Start Date End Date Taking? Authorizing Provider  megestrol (MEGACE) 40 MG tablet Take 1 tablet (40 mg total) by mouth 2 (two) times daily for 15 days. 09/20/19 10/05/19  Suzy Bouchard, PA-C  albuterol (PROVENTIL HFA;VENTOLIN HFA) 108 (90 BASE) MCG/ACT inhaler Inhale 1-2 puffs into the lungs every 6 (six) hours as needed for wheezing or shortness of breath. Patient not taking: Reported on 11/22/2016 08/13/14 06/05/19  Delos Haring, PA-C  cetirizine (ZYRTEC ALLERGY) 10 MG tablet Take 1 tablet (10 mg total) by mouth daily. Patient not taking: Reported on 11/22/2016 04/03/16 06/05/19  Waynetta Pean, PA-C  fluticasone [facility]) 50 MCG/ACT nasal spray Place 1 spray into both nostrils daily. Patient not taking: Reported on 11/22/2016 04/03/16 06/05/19  Waynetta Pean, PA-C    Allergies    Morphine and related and Oxycodone  Review of Systems  Review of Systems  Constitutional: Negative for activity change, chills, fatigue and fever.  Respiratory: Negative for shortness of breath.   Cardiovascular: Negative for chest pain.  Gastrointestinal: Negative for abdominal pain, diarrhea, nausea and vomiting.  Genitourinary: Positive for vaginal bleeding. Negative for dysuria and vaginal discharge.  Neurological: Negative for weakness, light-headedness and headaches.  All other systems reviewed and are negative.   Physical Exam Updated Vital Signs BP (!) 164/64 (BP Location: Left Arm)   Pulse 99 Comment: Simultaneous filing. User may not have seen previous data.  Temp 98.4 F (36.9 C) (Oral)   Resp 16   Wt 134.4 kg   LMP 09/13/2019   SpO2 99% Comment: Simultaneous filing. User may not have seen previous data.  BMI 47.84 kg/m   Physical  Exam Vitals and nursing note reviewed.  Constitutional:      General: She is not in acute distress.    Appearance: She is not ill-appearing.     Comments: Well appearing 24 year old  HENT:     Head: Normocephalic.     Mouth/Throat:     Comments: Moist mucus membranes Eyes:     Pupils: Pupils are equal, round, and reactive to light.  Cardiovascular:     Rate and Rhythm: Normal rate and regular rhythm.     Pulses: Normal pulses.     Heart sounds: Normal heart sounds. No murmur. No friction rub. No gallop.   Pulmonary:     Effort: Pulmonary effort is normal.     Breath sounds: Normal breath sounds.  Abdominal:     General: Abdomen is flat. Bowel sounds are normal. There is no distension.     Palpations: Abdomen is soft.     Tenderness: There is no abdominal tenderness. There is no guarding or rebound.  Musculoskeletal:     Cervical back: Neck supple.     Comments: Able to move all 4 extremities without difficulty.   Skin:    General: Skin is warm and dry.  Neurological:     General: No focal deficit present.     Mental Status: She is alert.  Psychiatric:        Mood and Affect: Mood normal.        Behavior: Behavior normal.     ED Results / Procedures / Treatments   Labs (all labs ordered are listed, but only abnormal results are displayed) Labs Reviewed  BASIC METABOLIC PANEL - Abnormal; Notable for the following components:      Result Value   Glucose, Bld 108 (*)    All other components within normal limits  CBC - Abnormal; Notable for the following components:   Hemoglobin 7.3 (*)    HCT 27.4 (*)    MCV 67.0 (*)    MCH 17.8 (*)    MCHC 26.6 (*)    RDW 24.1 (*)    Platelets 466 (*)    nRBC 0.5 (*)    All other components within normal limits  POC URINE PREG, ED    EKG None  Radiology No results found.  Procedures Procedures (including critical care time)  Medications Ordered in ED Medications - No data to display  ED Course  I have reviewed the  triage vital signs and the nursing notes.  Pertinent labs & imaging results that were available during my care of the patient were reviewed by me and considered in my medical decision making (see chart for details).  Clinical Course as of Sep 19 1620  Thu  Sep 20, 2019  1559 Pulse Rate: 99 [CA]  1616 Preg Test, Ur: NEGATIVE [CA]    Clinical Course User Index [CA] Mannie Stabile, PA-C   MDM Rules/Calculators/A&P                     24 year old female presents to the ED for repeat lab work to recent medical admission due to acute anemia needing a blood transfusion.  Patient was admitted to the hospital on 4/8-4/9 due to anemia secondary to irregular menstrual bleeding. Patient notes she is still bleeding which is common for her. Denies chest pain, shortness of breath, lightheadedness, and other physical complaints. Stable vitals. At triage, patient noted to be tachycardic at 108, but during my exam patient's HR in the low 90s. Patient in no acute distress and non-ill appearing. Physical exam reassuring. Will obtain CBC and BMP.  CBC significant for hemoglobin of 7.3. BMP reassuring with normal renal function and no major electrolyte derangements. Pregnancy test negative. Will consult OBGYN to inquire about Megace and close follow-up. Patient is not currently symptomatic, no blood transfusions warranted at this time.  Discussed case with Dr. Elroy Channel with OBGYN who recommends Megace 40mg  BID x 7 days and then 40mg  daily until her scheduled appointment. They will reach out to schedule an appointment. Will also discharge patient with iron supplements. Strict ED precautions discussed with patient. Patient states understanding and agrees to plan. Patient discharged home in no acute distress and stable vitals  Discussed case with Dr. who agrees with assessment and plan.  Final Clinical Impression(s) / ED Diagnoses Final diagnoses:  Menorrhagia with irregular cycle    Rx / DC Orders ED  Discharge Orders         Ordered    Consult to obstetrics / gynecology    Provider:  (Not yet assigned)   09/20/19 1536    megestrol (MEGACE) 40 MG tablet  2 times daily     09/20/19 1620           09/22/19, PA-C 09/20/19 1626    Mannie Stabile, MD 09/21/19 [phone]

## 2019-10-19 ENCOUNTER — Ambulatory Visit: Payer: Medicaid Other | Admitting: Family Medicine

## 2019-11-16 ENCOUNTER — Encounter (HOSPITAL_COMMUNITY): Payer: Self-pay

## 2019-11-16 ENCOUNTER — Other Ambulatory Visit: Payer: Self-pay

## 2019-11-16 ENCOUNTER — Emergency Department (HOSPITAL_COMMUNITY)
Admission: EM | Admit: 2019-11-16 | Discharge: 2019-11-17 | Disposition: A | Discharge status: Home / Self Care | Payer: Medicaid Other | Attending: Emergency Medicine | Admitting: Emergency Medicine

## 2019-11-16 DIAGNOSIS — N939 Abnormal uterine and vaginal bleeding, unspecified: Secondary | ICD-10-CM

## 2019-11-16 DIAGNOSIS — D649 Anemia, unspecified: Secondary | ICD-10-CM | POA: Insufficient documentation

## 2019-11-16 DIAGNOSIS — J45909 Unspecified asthma, uncomplicated: Secondary | ICD-10-CM | POA: Insufficient documentation

## 2019-11-16 LAB — COMPREHENSIVE METABOLIC PANEL
ALT: 26 U/L (ref 0–44)
AST: 17 U/L (ref 15–41)
Albumin: 4.2 g/dL (ref 3.5–5.0)
Alkaline Phosphatase: 67 U/L (ref 38–126)
Anion gap: 9 (ref 5–15)
BUN: 10 mg/dL (ref 6–20)
CO2: 24 mmol/L (ref 22–32)
Calcium: 9.4 mg/dL (ref 8.9–10.3)
Chloride: 108 mmol/L (ref 98–111)
Creatinine, Ser: 0.91 mg/dL (ref 0.44–1.00)
GFR calc Af Amer: >60 mL/min (ref >60)
GFR calc non Af Amer: >60 mL/min (ref >60)
Glucose, Bld: 84 mg/dL (ref 70–99)
Potassium: 3.9 mmol/L (ref 3.5–5.1)
Sodium: 141 mmol/L (ref 135–145)
Total Bilirubin: 0.8 mg/dL (ref 0.3–1.2)
Total Protein: 7.6 g/dL (ref 6.5–8.1)

## 2019-11-16 LAB — CBC
HCT: 28.1 % — ABNORMAL LOW (ref 36.0–46.0)
Hemoglobin: 7.6 g/dL — ABNORMAL LOW (ref 12.0–15.0)
MCH: 17.3 pg — ABNORMAL LOW (ref 26.0–34.0)
MCHC: 27 g/dL — ABNORMAL LOW (ref 30.0–36.0)
MCV: 64 fL — ABNORMAL LOW (ref 80.0–100.0)
Platelets: 506 10*3/uL — ABNORMAL HIGH (ref 150–400)
RBC: 4.39 MIL/uL (ref 3.87–5.11)
RDW: 20.8 % — ABNORMAL HIGH (ref 11.5–15.5)
WBC: 10.8 10*3/uL — ABNORMAL HIGH (ref 4.0–10.5)
nRBC: 0 % (ref 0.0–0.2)

## 2019-11-16 LAB — I-STAT BETA HCG BLOOD, ED (MC, WL, AP ONLY): I-stat hCG, quantitative: <5 m[IU]/mL (ref <5)

## 2019-11-16 NOTE — ED Triage Notes (Signed)
Pt reports that she feels like her hemoglobin is low. She states that it was 7 last time she felt this way. States that she has just been laying around all week because of feeling this way. Also reports that she is on her period.

## 2019-11-16 NOTE — ED Provider Notes (Signed)
[facility]-EMERGENCY DEPT Provider Note   CSN: [id number] Arrival date & time: 11/16/19  2138     History Chief Complaint  Patient presents with  . Abnormal Lab    Melissa Ramirez is a 24 y.o. female with a history of asthma, obesity, PCOS, anemia who presents to the emergency department with a chief complaint of vaginal bleeding.  The patient reports constant vaginal bleeding for the last week.  She has been changing her pads at least 3 times a day, but having to wear 2 pads at night due to the heaviness of her bleeding.  She reports that she has been extremely fatigued for the last week.  She has been having some intermittent abdominal cramping, loose stools, and nausea.  No fever, chills, dysuria, flank pain, urinary frequency or hesitancy, vaginal discharge, constipation, or vaginal pain.  No melena, hematochezia, or spontaneous bleeding.  She has been taking 325 mg of ferrous sulfate daily for the last week.  She has an appointment with Dr. Raynelle Dick, OB/GYN, on 6/14, but family was concerned about her worsening fatigue so she presented to the emergency department for further evaluation.  She states that her symptoms felt similar to when she required a blood transfusion for symptomatic anemia and April 2021.  No concerns for pregnancy.  She is not on any form of contraception.  She has no concerns for pregnancy.  The history is provided by the patient. No language interpreter was used.       Past Medical History:  Diagnosis Date  . Anxiety   . Asthma   . Obesity   . PCOS (polycystic ovarian syndrome)     Patient Active Problem List   Diagnosis Date Noted  . Symptomatic anemia 09/13/2019  . Obesity 02/21/2014  . PCOS (polycystic ovarian syndrome) 02/21/2014  . Asthma 02/21/2014  . Fibrocystic breast changes of both breasts 02/21/2014    Past Surgical History:  Procedure Laterality Date  . FOOT SURGERY  2009  . TONSILLECTOMY    . tubes in  ears       OB History    Gravida  0   Para  0   Term  0   Preterm  0   AB  0   Living  0     SAB  0   TAB  0   Ectopic  0   Multiple  0   Live Births              Family History  Problem Relation Age of Onset  . Hypertension Other   . Diabetes Other     Social History   Tobacco Use  . Smoking status: Never Smoker  . Smokeless tobacco: Never Used  Substance Use Topics  . Alcohol use: No  . Drug use: No    Home Medications Prior to Admission medications   Medication Sig Start Date End Date Taking? Authorizing Provider  ferrous sulfate 325 (65 FE) MG tablet Take 1 tablet (325 mg total) by mouth daily. 09/20/19  Yes Aberman, Merla Riches, PA-C  megestrol (MEGACE) 40 MG tablet Take 3 tablets x5 days then take 2 tablets x5 days, then 1 tablet daily until you finish the entire prescription. 11/17/19   Jesyca Weisenburger A, PA-C  albuterol (PROVENTIL HFA;VENTOLIN HFA) 108 (90 BASE) MCG/ACT inhaler Inhale 1-2 puffs into the lungs every 6 (six) hours as needed for wheezing or shortness of breath. Patient not taking: Reported on 11/22/2016 08/13/14 06/05/19  Neva Seat,  Tiffany, PA-C  cetirizine (ZYRTEC ALLERGY) 10 MG tablet Take 1 tablet (10 mg total) by mouth daily. Patient not taking: Reported on 11/22/2016 04/03/16 06/05/19  Everlene Farrier, PA-C  fluticasone [facility]) 50 MCG/ACT nasal spray Place 1 spray into both nostrils daily. Patient not taking: Reported on 11/22/2016 04/03/16 06/05/19  Everlene Farrier, PA-C    Allergies    Morphine and related and Oxycodone  Review of Systems   Review of Systems  Constitutional: Negative for activity change, chills, diaphoresis and fever.  Respiratory: Negative for shortness of breath.   Cardiovascular: Negative for chest pain.  Gastrointestinal: Positive for abdominal pain and nausea. Negative for constipation, diarrhea and vomiting.  Genitourinary: Positive for vaginal bleeding. Negative for dysuria.  Musculoskeletal: Negative  for back pain.  Skin: Negative for rash.  Allergic/Immunologic: Negative for immunocompromised state.  Neurological: Negative for headaches.  Psychiatric/Behavioral: Negative for confusion.    Physical Exam Updated Vital Signs BP (!) 152/90   Pulse (!) 106   Temp 99.4 F (37.4 C)   Resp 16   SpO2 100%   Physical Exam Vitals and nursing note reviewed.  Constitutional:      General: She is not in acute distress.    Comments: No acute distress.  HENT:     Head: Normocephalic.  Eyes:     Conjunctiva/sclera: Conjunctivae normal.  Cardiovascular:     Rate and Rhythm: Normal rate and regular rhythm.     Heart sounds: No murmur heard.  No friction rub. No gallop.   Pulmonary:     Effort: Pulmonary effort is normal. No respiratory distress.     Breath sounds: No stridor. No wheezing, rhonchi or rales.  Chest:     Chest wall: No tenderness.  Abdominal:     General: There is no distension.     Palpations: Abdomen is soft. There is no mass.     Tenderness: There is no abdominal tenderness. There is no right CVA tenderness, left CVA tenderness, guarding or rebound.     Hernia: No hernia is present.     Comments: Abdomen is soft, nontender, nondistended.  Musculoskeletal:     Cervical back: Neck supple.  Skin:    General: Skin is warm.     Findings: No rash.  Neurological:     Mental Status: She is alert.  Psychiatric:        Behavior: Behavior normal.     ED Results / Procedures / Treatments   Labs (all labs ordered are listed, but only abnormal results are displayed) Labs Reviewed  CBC - Abnormal; Notable for the following components:      Result Value   WBC 10.8 (*)    Hemoglobin 7.6 (*)    HCT 28.1 (*)    MCV 64.0 (*)    MCH 17.3 (*)    MCHC 27.0 (*)    RDW 20.8 (*)    Platelets 506 (*)    All other components within normal limits  COMPREHENSIVE METABOLIC PANEL  I-STAT BETA HCG BLOOD, ED ([facility], [facility], [facility] ONLY)  TYPE AND SCREEN    EKG None  Radiology No  results found.  Procedures Procedures (including critical care time)  Medications Ordered in ED Medications  megestrol (MEGACE) tablet 120 mg (has no administration in time range)    ED Course  I have reviewed the triage vital signs and the nursing notes.  Pertinent labs & imaging results that were available during my care of the patient were reviewed by me and considered  in my medical decision making (see chart for details).    MDM Rules/Calculators/A&P                          24 year old female with a history of asthma, obesity, PCOS, anemia presenting with heavy vaginal bleeding for the last week and worsening fatigue.  She has a history of PCOS and dysfunctional uterine bleeding.  She required blood transfusion for symptomatic anemia in April 2021.  She presented today after having similar symptoms to when she required a blood transfusion.  She is having no shortness of breath, dizziness, lightheadedness.  No syncope.  No other active bleeding.  Hemoglobin today is 7.6 and does not require transfusion today.  She has a microcytic anemia, likely iron deficiency anemia given heavy menstrual cycles.  She has a history of dysfunctional uterine bleeding and is presenting with similar symptoms today.  Repeat pelvic exam is not indicated.  Anemia panel was sent in April.  Will not repeat today.  She has a follow-up appointment with a new OB/GYN in 3 days.  However, given active bleeding, Dr. Elonda Husky, OB/GYN, was consulted who recommended starting the patient on 120 mg of Megace for 5 days, then 80 mg for 5 days, then 40 mg until she completes a 45 pill course of Megace.  Will give her first dose in the ED.  She was strongly encouraged to keep her follow-up appointment on Monday.  She is hemodynamically stable and in no acute distress.  Doubt ectopic pregnancy, spontaneous abortion, or PID.  ER return precautions given.  Safe for discharge to home with outpatient follow-up.    Final Clinical  Impression(s) / ED Diagnoses Final diagnoses:  Symptomatic anemia  Vaginal bleeding    Rx / DC Orders ED Discharge Orders         Ordered    megestrol (MEGACE) 40 MG tablet     Discontinue  Reprint     11/17/19 0006           Mikella Linsley A, PA-C 11/17/19 0026    Palumbo, April, MD 11/17/19 [id number]

## 2019-11-17 LAB — TYPE AND SCREEN
ABO/RH(D): A POS
Antibody Screen: NEGATIVE

## 2019-11-17 MED ORDER — MEGESTROL ACETATE 40 MG PO TABS: ORAL_TABLET | ORAL | 0 refills | Status: DC

## 2019-11-17 MED ORDER — MEGESTROL ACETATE 40 MG PO TABS
120.0000 mg | ORAL_TABLET | Freq: Every day | ORAL | Status: AC
  Administered 2019-11-17: 120 mg via ORAL
  Filled 2019-11-17: qty 3

## 2019-11-17 NOTE — Discharge Instructions (Addendum)
Thank you for allowing me to care for you today in the Emergency Department.   Please keep your follow-up appointment with Dr. Mayford Knife on Monday.  Starting tomorrow, take 120 mg of Megace (3 tablets) for a total of 5 days.  Then, take 80 mg (2 tablets) for a total of 5 days.  Then, take 40 mg (1 tablet) daily until you have finished the entire course of the medication.  Make sure to continue to take iron.  This will help with your fatigue.   Return to the emergency department if your bleeding significantly worsens despite taking the Megace, especially if you develop significant shortness of breath, if you pass out, develop high fevers, or other new, concerning symptoms.

## 2019-11-19 ENCOUNTER — Encounter: Payer: Self-pay | Admitting: Obstetrics & Gynecology

## 2019-11-19 ENCOUNTER — Other Ambulatory Visit: Payer: Self-pay

## 2019-11-19 ENCOUNTER — Ambulatory Visit (INDEPENDENT_AMBULATORY_CARE_PROVIDER_SITE_OTHER): Payer: Self-pay | Admitting: Obstetrics & Gynecology

## 2019-11-19 VITALS — BP 138/77 | HR 108 | Wt 288.2 lb

## 2019-11-19 DIAGNOSIS — E282 Polycystic ovarian syndrome: Secondary | ICD-10-CM

## 2019-11-19 MED ORDER — DROSPIRENONE-ETHINYL ESTRADIOL 3-0.02 MG PO TABS: 1.0000 | ORAL_TABLET | Freq: Every day | ORAL | 11 refills | Status: DC

## 2019-11-19 NOTE — Patient Instructions (Signed)
Diet for Polycystic Ovary Syndrome °Polycystic ovary syndrome (PCOS) is a disorder of the chemicals (hormones) that regulate a woman's reproductive system, including monthly periods (menstruation). The condition causes important hormones to be out of balance. PCOS can: °· Stop your periods or make them irregular. °· Cause cysts to develop on your ovaries. °· Make it difficult to get pregnant. °· Stop your body from responding to the effects of insulin (insulin resistance). Insulin resistance can lead to obesity and diabetes. °Changing what you eat can help you manage PCOS and improve your health. Following a balanced diet can help you lose weight and improve the way that your body uses insulin. °What are tips for following this plan? °· Follow a balanced diet for meals and snacks. Eat breakfast, lunch, dinner, and one or two snacks every day. °· Include protein in each meal and snack. °· Choose whole grains instead of products that are made with refined flour. °· Eat a variety of foods. °· Exercise regularly as told by your health care provider. Aim to do 30 or more minutes of exercise on most days of the week. °· If you are overweight or obese: °? Pay attention to how many calories you eat. Cutting down on calories can help you lose weight. °? Work with your health care provider or a diet and nutrition specialist (dietitian) to figure out how many calories you need each day. °What foods can I eat? ° °Fruits °Include a variety of colors and types. All fruits are helpful for PCOS. °Vegetables °Include a variety of colors and types. All vegetables are helpful for PCOS. °Grains °Whole grains, such as whole wheat. Whole-grain breads, crackers, cereals, and pasta. Unsweetened oatmeal, bulgur, barley, quinoa, and brown rice. Tortillas made from corn or whole-wheat flour. °Meats and other proteins °Low-fat (lean) proteins, such as fish, chicken, beans, eggs, and tofu. °Dairy °Low-fat dairy products, such as skim milk,  cheese sticks, and yogurt. °Beverages °Low-fat or fat-free drinks, such as water, low-fat milk, sugar-free drinks, and small amounts of 100% fruit juice. °Seasonings and condiments °Ketchup. Mustard. Barbecue sauce. Relish. Low-fat or fat-free mayonnaise. °Fats and oils °Olive oil or canola oil. Walnuts and almonds. °The items listed above may not be a complete list of recommended foods and beverages. Contact a dietitian for more options. °What foods are not recommended? °Foods that are high in calories or fat. Fried foods. Sweets. Products that are made from refined white flour, including white bread, pastries, white rice, and pasta. °The items listed above may not be a complete list of foods and beverages to avoid. Contact a dietitian for more information. °Summary °· PCOS is a hormonal imbalance that affects a woman's reproductive system. °· You can help to manage your PCOS by exercising regularly and eating a healthy, varied diet of vegetables, fruit, whole grains, low-fat (lean) protein, and low-fat dairy products. °· Changing what you eat can improve the way that your body uses insulin, help your hormones reach normal levels, and help you lose weight. °This information is not intended to replace advice given to you by your health care provider. Make sure you discuss any questions you have with your health care provider. °Document Revised: 09/13/2018 Document Reviewed: 03/28/2017 °Elsevier Patient Education © 2020 [employer] Inc[employer] ° °

## 2019-11-19 NOTE — Progress Notes (Signed)
Pt declines [facility] due to pt feeling like she feels this because of the vaginal bleeding.  Pt advised to contact the office with concerns and we can schedule her an appt.   Addison Naegeli, RN

## 2019-11-19 NOTE — Progress Notes (Signed)
Melissa Ramirez is an 24 y.o. female. Who presents with DUB secondary to PCOS.  Was managed previously with metformin and megace, pt was loss to follow up and has been on no meds for the past 4-6 months.  Menses heavy and required recent transfusion due to chronic anemia. PT also c/o female pattern hair growth face and chin.    Pertinent Gynecological History: Menses: irregular occurring approximately every 18 days with spotting approximately 20 days per month Bleeding: intermenstrual bleeding Contraception: none DES exposure: denies Blood transfusions: yes 1 Sexually transmitted diseases: no past history Previous GYN Procedures: DNC    Menstrual History: Menarche age: 68 Patient's last menstrual period was 11/06/2019.    Past Medical History:  Diagnosis Date  . Anxiety   . Asthma   . Obesity   . PCOS (polycystic ovarian syndrome)     Past Surgical History:  Procedure Laterality Date  . FOOT SURGERY  2009  . TONSILLECTOMY    . tubes in ears      Family History  Problem Relation Age of Onset  . Hypertension Other   . Diabetes Other     Social History:  reports that she has never smoked. She has never used smokeless tobacco. She reports that she does not drink alcohol and does not use drugs.  Allergies:  Allergies  Allergen Reactions  . Morphine And Related Itching  . Oxycodone Itching    (Not in a hospital admission)   Review of Systems  Constitutional: Negative for activity change, appetite change, fatigue and unexpected weight change.  HENT: Negative.   Eyes: Negative.   Respiratory: Negative.  Negative for chest tightness, shortness of breath and wheezing.   Cardiovascular: Negative.  Negative for chest pain and leg swelling.  Gastrointestinal: Negative.  Negative for abdominal distention, abdominal pain, constipation, diarrhea, nausea and vomiting.  Endocrine: Negative.   Genitourinary: Positive for menstrual problem and pelvic pain. Negative for dysuria and  hematuria.  Neurological: Negative.  Negative for dizziness, weakness, light-headedness, numbness and headaches.  Hematological: Negative.   Psychiatric/Behavioral: Negative.  Negative for agitation, confusion and decreased concentration.    Blood pressure 138/77, pulse (!) 108, weight 288 lb 3.2 oz (130.7 kg), last menstrual period 11/06/2019. Physical Exam  Vitals reviewed. Constitutional: She is oriented to person, place, and time. She appears well-developed.  HENT:  Head: Normocephalic and atraumatic.  Eyes: Pupils are equal, round, and reactive to light.  Cardiovascular: Normal rate.  Respiratory: Effort normal.  GI: Soft. She exhibits no distension. There is no abdominal tenderness. There is no guarding.  Musculoskeletal:     Cervical back: Normal range of motion.  Neurological: She is alert and oriented to person, place, and time.  Skin: Skin is warm and dry.  Psychiatric: Her behavior is normal.    No results found for this or any previous visit (from the past 24 hour(s)).  No results found.  Assessment/Plan: 1. PCOS: not well controlled over the past few months. Will start on Yaz, order PCOS labs incudling A1C, AMH, TSH, prolatin, insulin.  Follow up in 3 months for repeat labs.  Malachy Chamber 11/19/2019, 4:14 PM

## 2019-11-20 LAB — HEMOGLOBIN A1C
Est. average glucose Bld gHb Est-mCnc: 114 mg/dL
Hgb A1c MFr Bld: 5.6 % (ref 4.8–5.6)

## 2019-12-17 ENCOUNTER — Ambulatory Visit: Payer: Medicaid Other | Admitting: Family Medicine

## 2020-01-31 ENCOUNTER — Other Ambulatory Visit: Payer: Self-pay

## 2020-01-31 ENCOUNTER — Ambulatory Visit (HOSPITAL_COMMUNITY)
Admission: EM | Admit: 2020-01-31 | Discharge: 2020-01-31 | Disposition: A | Discharge status: Home / Self Care | Payer: Medicaid Other | Attending: Family Medicine | Admitting: Family Medicine

## 2020-01-31 ENCOUNTER — Encounter (HOSPITAL_COMMUNITY): Payer: Self-pay

## 2020-01-31 DIAGNOSIS — U071 COVID-19: Secondary | ICD-10-CM | POA: Diagnosis not present

## 2020-01-31 DIAGNOSIS — Z20822 Contact with and (suspected) exposure to covid-19: Secondary | ICD-10-CM

## 2020-01-31 DIAGNOSIS — J069 Acute upper respiratory infection, unspecified: Secondary | ICD-10-CM | POA: Diagnosis present

## 2020-01-31 MED ORDER — PROMETHAZINE-DM 6.25-15 MG/5ML PO SYRP: 5.0000 mL | ORAL_SOLUTION | Freq: Four times a day (QID) | ORAL | 0 refills | Status: DC | PRN

## 2020-01-31 NOTE — ED Provider Notes (Signed)
[facility]    CSN: [id number] Arrival date & time: 01/31/20  1453      History   Chief Complaint Chief Complaint  Patient presents with  . Cough  . Nasal Congestion    HPI Melissa Ramirez is a 24 y.o. female.   HPI  Both parents at home have Covid.  Patient now has cough, headache, congestion and runny nose since yesterday.  When asked if she thought she had Covid she states "I could not".  She does not have a history of Covid vaccinations. She is at increased risk from Covid for morbid obesity. Also has asthma but states she is not particularly short of breath.  Has not needed her inhaler. Is requesting cough medication.  Past Medical History:  Diagnosis Date  . Anxiety   . Asthma   . Obesity   . PCOS (polycystic ovarian syndrome)     Patient Active Problem List   Diagnosis Date Noted  . Symptomatic anemia 09/13/2019  . Obesity 02/21/2014  . PCOS (polycystic ovarian syndrome) 02/21/2014  . Asthma 02/21/2014  . Fibrocystic breast changes of both breasts 02/21/2014    Past Surgical History:  Procedure Laterality Date  . FOOT SURGERY  2009  . TONSILLECTOMY    . tubes in ears      OB History    Gravida  0   Para  0   Term  0   Preterm  0   AB  0   Living  0     SAB  0   TAB  0   Ectopic  0   Multiple  0   Live Births               Home Medications    Prior to Admission medications   Medication Sig Start Date End Date Taking? Authorizing Provider  drospirenone-ethinyl estradiol (YAZ) 3-0.02 MG tablet Take 1 tablet by mouth daily. 11/19/19  Yes Malachy Chamber, MD  ferrous sulfate 325 (65 FE) MG tablet Take 1 tablet (325 mg total) by mouth daily. 09/20/19   Mannie Stabile, PA-C  promethazine-dextromethorphan (PROMETHAZINE-DM) 6.25-15 MG/5ML syrup Take 5 mLs by mouth 4 (four) times daily as needed for cough. 01/31/20   Eustace Moore, MD  albuterol (PROVENTIL HFA;VENTOLIN HFA) 108 (90 BASE) MCG/ACT inhaler Inhale 1-2  puffs into the lungs every 6 (six) hours as needed for wheezing or shortness of breath. Patient not taking: Reported on 11/22/2016 08/13/14 06/05/19  Marlon Pel, PA-C  cetirizine (ZYRTEC ALLERGY) 10 MG tablet Take 1 tablet (10 mg total) by mouth daily. Patient not taking: Reported on 11/22/2016 04/03/16 06/05/19  Everlene Farrier, PA-C  fluticasone [facility]) 50 MCG/ACT nasal spray Place 1 spray into both nostrils daily. Patient not taking: Reported on 11/22/2016 04/03/16 06/05/19  Everlene Farrier, PA-C    Family History Family History  Problem Relation Age of Onset  . Hypertension Other   . Diabetes Other     Social History Social History   Tobacco Use  . Smoking status: Never Smoker  . Smokeless tobacco: Never Used  Substance Use Topics  . Alcohol use: No  . Drug use: No     Allergies   Morphine and related and Oxycodone   Review of Systems Review of Systems  See HPI Physical Exam Triage Vital Signs ED Triage Vitals  Enc Vitals Group     BP 01/31/20 1654 112/77     Pulse Rate 01/31/20 1654 (!) 102  Resp 01/31/20 1654 18     Temp 01/31/20 1654 99.3 F (37.4 C)     Temp Source 01/31/20 1654 Oral     SpO2 01/31/20 1654 99 %     Weight --      Height --      Head Circumference --      Peak Flow --      Pain Score 01/31/20 1659 0     Pain Loc --      Pain Edu? --      Excl. in GC? --    No data found.  Updated Vital Signs BP 112/77 (BP Location: Right Arm)   Pulse (!) 102   Temp 99.3 F (37.4 C) (Oral)   Resp 18   LMP 12/10/2019 (Approximate)   SpO2 99%      Physical Exam Constitutional:      General: She is not in acute distress.    Appearance: She is well-developed. She is obese.  HENT:     Head: Normocephalic and atraumatic.     Mouth/Throat:     Comments: Mask in place Eyes:     Conjunctiva/sclera: Conjunctivae normal.     Pupils: Pupils are equal, round, and reactive to light.  Cardiovascular:     Rate and Rhythm: Normal rate.      Heart sounds: Normal heart sounds.  Pulmonary:     Effort: Pulmonary effort is normal. No respiratory distress.     Comments: Lungs are clear Musculoskeletal:        General: Normal range of motion.     Cervical back: Normal range of motion.  Skin:    General: Skin is warm and dry.  Neurological:     Mental Status: She is alert.  Psychiatric:        Behavior: Behavior normal.      UC Treatments / Results  Labs (all labs ordered are listed, but only abnormal results are displayed) Labs Reviewed  SARS CORONAVIRUS 2 (TAT 6-24 HRS)    EKG   Radiology No results found.  Procedures Procedures (including critical care time)  Medications Ordered in UC Medications - No data to display  Initial Impression / Assessment and Plan / UC Course  I have reviewed the triage vital signs and the nursing notes.  Pertinent labs & imaging results that were available during my care of the patient were reviewed by me and considered in my medical decision making (see chart for details).     Patient likely has a Covid infection.  She is advised to quarantine at home.  Is advised to call for questions or problems. Final Clinical Impressions(s) / UC Diagnoses   Final diagnoses:  Close exposure to COVID-19 virus  Viral URI with cough     Discharge Instructions     Go home to rest Drink plenty of fluids Take Tylenol for pain or fever You may take over-the-counter cough and cold medicines as needed You must quarantine at home until your test result is available You can check for your test result in MyChart CALL FOR QUESTIONS    ED Prescriptions    Medication Sig Dispense Auth. Provider   promethazine-dextromethorphan (PROMETHAZINE-DM) 6.25-15 MG/5ML syrup Take 5 mLs by mouth 4 (four) times daily as needed for cough. 240 mL Eustace Moore, MD     PDMP not reviewed this encounter.   Eustace Moore, MD 01/31/20 2241

## 2020-01-31 NOTE — Discharge Instructions (Signed)
Go home to rest Drink plenty of fluids Take Tylenol for pain or fever You may take over-the-counter cough and cold medicines as needed You must quarantine at home until your test result is available You can check for your test result in MyChart CALL FOR QUESTIONS 

## 2020-01-31 NOTE — ED Triage Notes (Signed)
Pt c/o productive cough with yellow sputum, HA, congestion, runny nose since yesterday. Pt states her parents tested positive for COVID yesterday.  Denies SOB, fever, chills, abdom pain, n/v/d.

## 2020-02-01 LAB — SARS CORONAVIRUS 2 (TAT 6-24 HRS): SARS Coronavirus 2: POSITIVE — AB

## 2020-02-02 ENCOUNTER — Telehealth: Payer: Self-pay | Admitting: Nurse Practitioner

## 2020-02-02 NOTE — Telephone Encounter (Signed)
Called to discuss with Wendy Poet about Covid symptoms and the use of casirivimab/imdevimab, a combination monoclonal antibody infusion for those with mild to moderate Covid symptoms and at a high risk of hospitalization.     Pt is qualified for this infusion at the [facility] infusion center due to co-morbid conditions (as indicated below)   Unable to reach. No voicemail on phone. Mychart message sent.    Patient Active Problem List   Diagnosis Date Noted  . Symptomatic anemia 09/13/2019  . Obesity 02/21/2014  . PCOS (polycystic ovarian syndrome) 02/21/2014  . Asthma 02/21/2014  . Fibrocystic breast changes of both breasts 02/21/2014    Willette Alma, AGPCNP-BC

## 2020-02-10 ENCOUNTER — Ambulatory Visit (HOSPITAL_COMMUNITY)
Admission: EM | Admit: 2020-02-10 | Discharge: 2020-02-10 | Disposition: A | Discharge status: Home / Self Care | Payer: Medicaid Other

## 2020-02-10 ENCOUNTER — Encounter (HOSPITAL_COMMUNITY): Payer: Self-pay

## 2020-02-10 ENCOUNTER — Other Ambulatory Visit: Payer: Self-pay

## 2020-02-10 DIAGNOSIS — R05 Cough: Secondary | ICD-10-CM

## 2020-02-10 DIAGNOSIS — R059 Cough, unspecified: Secondary | ICD-10-CM

## 2020-02-10 NOTE — ED Triage Notes (Signed)
Pt c/o non-productive cough for approx 5 days that is not improving with OTC cough and flu medications. Pt states she never filled Rx for Promethazine b/c she had taken it before and "it didn't work". Also c/o diarrhea for approx 6 days, is improving. Had one diarrhea episode this morning, no BM since. Was COVID positive on 08/26/21and had symptom onset 08/24. Tolerating po fluids, and solid food. Denies SOB, fever, chills or other c/o.

## 2020-02-10 NOTE — ED Provider Notes (Signed)
[facility]    CSN: [id number] Arrival date & time: 02/10/20  1620      History   Chief Complaint Chief Complaint  Patient presents with  . Cough    HPI Melissa Ramirez is a 24 y.o. female.   Reports lingering cough after positive Covid diagnosis 01/31/20. Reports that she has not taken any medications for this. Reports that she was seen in UC and was prescribed cough syrup. Reports that she was unable to get to her pharmacy and pick it up. Denies headache, sore throat, nausea, vomiting, diarrhea, rash, fever, other symptoms.   ROS per HPI  The history is provided by the patient.    Past Medical History:  Diagnosis Date  . Anxiety   . Asthma   . Obesity   . PCOS (polycystic ovarian syndrome)     Patient Active Problem List   Diagnosis Date Noted  . Symptomatic anemia 09/13/2019  . Obesity 02/21/2014  . PCOS (polycystic ovarian syndrome) 02/21/2014  . Asthma 02/21/2014  . Fibrocystic breast changes of both breasts 02/21/2014    Past Surgical History:  Procedure Laterality Date  . FOOT SURGERY  2009  . TONSILLECTOMY    . tubes in ears      OB History    Gravida  0   Para  0   Term  0   Preterm  0   AB  0   Living  0     SAB  0   TAB  0   Ectopic  0   Multiple  0   Live Births               Home Medications    Prior to Admission medications   Medication Sig Start Date End Date Taking? Authorizing Provider  drospirenone-ethinyl estradiol (YAZ) 3-0.02 MG tablet Take 1 tablet by mouth daily. 11/19/19  Yes Malachy Chamber, MD  ferrous sulfate 325 (65 FE) MG tablet Take 1 tablet (325 mg total) by mouth daily. 09/20/19   Mannie Stabile, PA-C  promethazine-dextromethorphan (PROMETHAZINE-DM) 6.25-15 MG/5ML syrup Take 5 mLs by mouth 4 (four) times daily as needed for cough. 01/31/20   Eustace Moore, MD  albuterol (PROVENTIL HFA;VENTOLIN HFA) 108 (90 BASE) MCG/ACT inhaler Inhale 1-2 puffs into the lungs every 6 (six) hours as  needed for wheezing or shortness of breath. Patient not taking: Reported on 11/22/2016 08/13/14 06/05/19  Marlon Pel, PA-C  cetirizine (ZYRTEC ALLERGY) 10 MG tablet Take 1 tablet (10 mg total) by mouth daily. Patient not taking: Reported on 11/22/2016 04/03/16 06/05/19  Everlene Farrier, PA-C  fluticasone [facility]) 50 MCG/ACT nasal spray Place 1 spray into both nostrils daily. Patient not taking: Reported on 11/22/2016 04/03/16 06/05/19  Everlene Farrier, PA-C    Family History Family History  Problem Relation Age of Onset  . Hypertension Other   . Diabetes Other     Social History Social History   Tobacco Use  . Smoking status: Never Smoker  . Smokeless tobacco: Never Used  Substance Use Topics  . Alcohol use: No  . Drug use: No     Allergies   Morphine and related and Oxycodone   Review of Systems Review of Systems   Physical Exam Triage Vital Signs ED Triage Vitals  Enc Vitals Group     BP 02/10/20 1813 125/86     Pulse Rate 02/10/20 1813 98     Resp 02/10/20 1813 19     Temp 02/10/20 1813  98.4 F (36.9 C)     Temp Source 02/10/20 1813 Oral     SpO2 02/10/20 1813 98 %     Weight --      Height --      Head Circumference --      Peak Flow --      Pain Score 02/10/20 1811 0     Pain Loc --      Pain Edu? --      Excl. in GC? --    No data found.  Updated Vital Signs BP 125/86 (BP Location: Left Arm)   Pulse 98   Temp 98.4 F (36.9 C) (Oral)   Resp 19   SpO2 98%   Visual Acuity Right Eye Distance:   Left Eye Distance:   Bilateral Distance:    Right Eye Near:   Left Eye Near:    Bilateral Near:     Physical Exam Vitals and nursing note reviewed.  Constitutional:      General: She is not in acute distress.    Appearance: She is well-developed. She is obese.  HENT:     Head: Normocephalic and atraumatic.     Nose: Nose normal.     Mouth/Throat:     Mouth: Mucous membranes are moist.     Pharynx: Oropharynx is clear.  Eyes:      Extraocular Movements: Extraocular movements intact.     Conjunctiva/sclera: Conjunctivae normal.     Pupils: Pupils are equal, round, and reactive to light.  Cardiovascular:     Rate and Rhythm: Normal rate and regular rhythm.     Heart sounds: Normal heart sounds. No murmur heard.   Pulmonary:     Effort: Pulmonary effort is normal. No respiratory distress.     Breath sounds: Normal breath sounds. No stridor. No wheezing, rhonchi or rales.  Chest:     Chest wall: No tenderness.  Abdominal:     General: Bowel sounds are normal. There is no distension.     Palpations: Abdomen is soft.     Tenderness: There is no abdominal tenderness.  Musculoskeletal:        General: Normal range of motion.     Cervical back: Neck supple.  Skin:    General: Skin is warm and dry.     Capillary Refill: Capillary refill takes less than 2 seconds.  Neurological:     General: No focal deficit present.     Mental Status: She is alert and oriented to person, place, and time.  Psychiatric:        Mood and Affect: Mood normal.        Behavior: Behavior normal.        Thought Content: Thought content normal.      UC Treatments / Results  Labs (all labs ordered are listed, but only abnormal results are displayed) Labs Reviewed - No data to display  EKG   Radiology No results found.  Procedures Procedures (including critical care time)  Medications Ordered in UC Medications - No data to display  Initial Impression / Assessment and Plan / UC Course  I have reviewed the triage vital signs and the nursing notes.  Pertinent labs & imaging results that were available during my care of the patient were reviewed by me and considered in my medical decision making (see chart for details).     Cough  Presents with lingering dry cough after 14 day quarantine after positive Covid diagnosis Was unable to pick up cough  syrup from her pharmacy and states that she did not realize that the prescription  would still be available Discussed with patient that the cough with Covid can outlast any other symptoms, and that she would benefit from taking the previously prescribed cough syrup. Verbalizes understanding and states that she will get to her pharmacy to pick up the medication Follow up as needed Final Clinical Impressions(s) / UC Diagnoses   Final diagnoses:  Cough     Discharge Instructions     Pick up the cough syrup from the pharmacy  Take zyrtec daily  The cough from Covid can linger  Follow up with this office or with primary care as needed    ED Prescriptions    None     PDMP not reviewed this encounter.   Moshe Cipro, NP 02/11/20 1027

## 2020-02-10 NOTE — Discharge Instructions (Addendum)
Pick up the cough syrup from the pharmacy  Take zyrtec daily  The cough from Covid can linger  Follow up with this office or with primary care as needed

## 2020-05-11 DIAGNOSIS — J45909 Unspecified asthma, uncomplicated: Secondary | ICD-10-CM | POA: Insufficient documentation

## 2020-05-11 DIAGNOSIS — R1032 Left lower quadrant pain: Secondary | ICD-10-CM | POA: Insufficient documentation

## 2020-05-11 DIAGNOSIS — R197 Diarrhea, unspecified: Secondary | ICD-10-CM | POA: Insufficient documentation

## 2020-05-12 ENCOUNTER — Encounter (HOSPITAL_COMMUNITY): Payer: Self-pay

## 2020-05-12 ENCOUNTER — Emergency Department (HOSPITAL_COMMUNITY)
Admission: EM | Admit: 2020-05-12 | Discharge: 2020-05-12 | Disposition: A | Discharge status: Home / Self Care | Payer: Medicaid Other | Attending: Emergency Medicine | Admitting: Emergency Medicine

## 2020-05-12 ENCOUNTER — Other Ambulatory Visit: Payer: Self-pay

## 2020-05-12 DIAGNOSIS — R1032 Left lower quadrant pain: Secondary | ICD-10-CM

## 2020-05-12 LAB — URINALYSIS, ROUTINE W REFLEX MICROSCOPIC
Bilirubin Urine: NEGATIVE
Glucose, UA: NEGATIVE mg/dL
Hgb urine dipstick: NEGATIVE
Ketones, ur: NEGATIVE mg/dL
Leukocytes,Ua: NEGATIVE
Nitrite: NEGATIVE
Protein, ur: NEGATIVE mg/dL
Specific Gravity, Urine: 1.013 (ref 1.005–1.030)
pH: 6 (ref 5.0–8.0)

## 2020-05-12 LAB — CBC
HCT: 30.7 % — ABNORMAL LOW (ref 36.0–46.0)
Hemoglobin: 8.9 g/dL — ABNORMAL LOW (ref 12.0–15.0)
MCH: 19 pg — ABNORMAL LOW (ref 26.0–34.0)
MCHC: 29 g/dL — ABNORMAL LOW (ref 30.0–36.0)
MCV: 65.6 fL — ABNORMAL LOW (ref 80.0–100.0)
Platelets: 409 10*3/uL — ABNORMAL HIGH (ref 150–400)
RBC: 4.68 MIL/uL (ref 3.87–5.11)
RDW: 17.5 % — ABNORMAL HIGH (ref 11.5–15.5)
WBC: 9.5 10*3/uL (ref 4.0–10.5)
nRBC: 0 % (ref 0.0–0.2)

## 2020-05-12 LAB — COMPREHENSIVE METABOLIC PANEL
ALT: 20 U/L (ref 0–44)
AST: 17 U/L (ref 15–41)
Albumin: 3.7 g/dL (ref 3.5–5.0)
Alkaline Phosphatase: 62 U/L (ref 38–126)
Anion gap: 7 (ref 5–15)
BUN: 10 mg/dL (ref 6–20)
CO2: 26 mmol/L (ref 22–32)
Calcium: 9.2 mg/dL (ref 8.9–10.3)
Chloride: 106 mmol/L (ref 98–111)
Creatinine, Ser: 1.14 mg/dL — ABNORMAL HIGH (ref 0.44–1.00)
GFR, Estimated: >60 mL/min (ref >60)
Glucose, Bld: 107 mg/dL — ABNORMAL HIGH (ref 70–99)
Potassium: 3.7 mmol/L (ref 3.5–5.1)
Sodium: 139 mmol/L (ref 135–145)
Total Bilirubin: 0.6 mg/dL (ref 0.3–1.2)
Total Protein: 6.8 g/dL (ref 6.5–8.1)

## 2020-05-12 LAB — I-STAT BETA HCG BLOOD, ED (MC, WL, AP ONLY): I-stat hCG, quantitative: <5 m[IU]/mL (ref <5)

## 2020-05-12 LAB — WET PREP, GENITAL
Clue Cells Wet Prep HPF POC: NONE SEEN
Sperm: NONE SEEN
Trich, Wet Prep: NONE SEEN
Yeast Wet Prep HPF POC: NONE SEEN

## 2020-05-12 LAB — LIPASE, BLOOD: Lipase: 23 U/L (ref 11–51)

## 2020-05-12 MED ORDER — KETOROLAC TROMETHAMINE 60 MG/2ML IM SOLN
30.0000 mg | Freq: Once | INTRAMUSCULAR | Status: AC
  Administered 2020-05-12: 30 mg via INTRAMUSCULAR
  Filled 2020-05-12: qty 2

## 2020-05-12 NOTE — ED Triage Notes (Signed)
Patient arrived with complaints of left sided flank pain over the last few days. Also reporting some diarrhea. No NV or urinary symptoms.

## 2020-05-12 NOTE — ED Provider Notes (Signed)
[facility]-EMERGENCY DEPT Provider Note  CSN: [id number] Arrival date & time: 05/11/20 2358  Chief Complaint(s) No chief complaint on file.  HPI Melissa Ramirez is a 24 y.o. female  Who presents to the emergency department with 2 days of left lower quadrant abdominal discomfort Constant and fluctuating in nature Exacerbated with palpation and movement. Gradual onset. No associated nausea or vomiting. Patient did report diarrhea for the past 2 days. No known sick contacts. No urinary symptoms. No vaginal bleeding or discharge.  Last menstrual cycle was November 24.   HPI  Past Medical History Past Medical History:  Diagnosis Date  . Anxiety   . Asthma   . Obesity   . PCOS (polycystic ovarian syndrome)    Patient Active Problem List   Diagnosis Date Noted  . Symptomatic anemia 09/13/2019  . Obesity 02/21/2014  . PCOS (polycystic ovarian syndrome) 02/21/2014  . Asthma 02/21/2014  . Fibrocystic breast changes of both breasts 02/21/2014   Home Medication(s) Prior to Admission medications   Medication Sig Start Date End Date Taking? Authorizing Provider  drospirenone-ethinyl estradiol (YAZ) 3-0.02 MG tablet Take 1 tablet by mouth daily. 11/19/19   Malachy Chamber, MD  ferrous sulfate 325 (65 FE) MG tablet Take 1 tablet (325 mg total) by mouth daily. 09/20/19   Mannie Stabile, PA-C  promethazine-dextromethorphan (PROMETHAZINE-DM) 6.25-15 MG/5ML syrup Take 5 mLs by mouth 4 (four) times daily as needed for cough. 01/31/20   Eustace Moore, MD  albuterol (PROVENTIL HFA;VENTOLIN HFA) 108 (90 BASE) MCG/ACT inhaler Inhale 1-2 puffs into the lungs every 6 (six) hours as needed for wheezing or shortness of breath. Patient not taking: Reported on 11/22/2016 08/13/14 06/05/19  Marlon Pel, PA-C  cetirizine (ZYRTEC ALLERGY) 10 MG tablet Take 1 tablet (10 mg total) by mouth daily. Patient not taking: Reported on 11/22/2016 04/03/16 06/05/19  Everlene Farrier,  PA-C  fluticasone [facility]) 50 MCG/ACT nasal spray Place 1 spray into both nostrils daily. Patient not taking: Reported on 11/22/2016 04/03/16 06/05/19  Everlene Farrier, PA-C                                                                                                                                    Past Surgical History Past Surgical History:  Procedure Laterality Date  . FOOT SURGERY  2009  . TONSILLECTOMY    . tubes in ears     Family History Family History  Problem Relation Age of Onset  . Hypertension Other   . Diabetes Other     Social History Social History   Tobacco Use  . Smoking status: Never Smoker  . Smokeless tobacco: Never Used  Substance Use Topics  . Alcohol use: No  . Drug use: No   Allergies Morphine and related and Oxycodone  Review of Systems Review of Systems All other systems are reviewed and are negative for acute change except as noted in the  HPI  Physical Exam Vital Signs  I have reviewed the triage vital signs BP 139/85 (BP Location: Right Arm)   Pulse 77   Temp 98.9 F (37.2 C) (Oral)   Resp 16   SpO2 99%   Physical Exam Vitals reviewed.  Constitutional:      General: She is not in acute distress.    Appearance: She is well-developed. She is obese. She is not diaphoretic.  HENT:     Head: Normocephalic and atraumatic.     Right Ear: External ear normal.     Left Ear: External ear normal.     Nose: Nose normal.  Eyes:     General: No scleral icterus.    Conjunctiva/sclera: Conjunctivae normal.  Neck:     Trachea: Phonation normal.  Cardiovascular:     Rate and Rhythm: Normal rate and regular rhythm.  Pulmonary:     Effort: Pulmonary effort is normal. No respiratory distress.     Breath sounds: No stridor.  Abdominal:     General: There is no distension.     Tenderness: There is abdominal tenderness (mild) in the left lower quadrant. There is no right CVA tenderness, left CVA tenderness, guarding or rebound.    Genitourinary:    Comments: She declined pelvic exam. Opted for self swab. Musculoskeletal:        General: Normal range of motion.     Cervical back: Normal range of motion.  Neurological:     Mental Status: She is alert and oriented to person, place, and time.  Psychiatric:        Behavior: Behavior normal.     ED Results and Treatments Labs (all labs ordered are listed, but only abnormal results are displayed) Labs Reviewed  WET PREP, GENITAL - Abnormal; Notable for the following components:      Result Value   WBC, Wet Prep HPF POC FEW (*)    All other components within normal limits  COMPREHENSIVE METABOLIC PANEL - Abnormal; Notable for the following components:   Glucose, Bld 107 (*)    Creatinine, Ser 1.14 (*)    All other components within normal limits  CBC - Abnormal; Notable for the following components:   Hemoglobin 8.9 (*)    HCT 30.7 (*)    MCV 65.6 (*)    MCH 19.0 (*)    MCHC 29.0 (*)    RDW 17.5 (*)    Platelets 409 (*)    All other components within normal limits  LIPASE, BLOOD  URINALYSIS, ROUTINE W REFLEX MICROSCOPIC  I-STAT BETA HCG BLOOD, ED ([facility], [facility], [facility] ONLY)  GC/CHLAMYDIA PROBE AMP ([place]) NOT AT [facility]                                                                                                                         EKG  EKG Interpretation  Date/Time:    Ventricular Rate:    PR Interval:    QRS Duration:   QT Interval:  QTC Calculation:   R Axis:     Text Interpretation:        Radiology No results found.  Pertinent labs & imaging results that were available during my care of the patient were reviewed by me and considered in my medical decision making (see chart for details).  Medications Ordered in ED Medications  ketorolac (TORADOL) injection 30 mg (30 mg Intramuscular Given 05/12/20 0154)                                                                                                                                     Procedures Procedures  (including critical care time)  Medical Decision Making / ED Course I have reviewed the nursing notes for this encounter and the patient's prior records (if available in EHR or on provided paperwork).   Wendy PoetKayla D Mccluskey was evaluated in Emergency Department on 05/12/2020 for the symptoms described in the history of present illness. She was evaluated in the context of the global COVID-19 pandemic, which necessitated consideration that the patient might be at risk for infection with the SARS-CoV-2 virus that causes COVID-19. Institutional protocols and algorithms that pertain to the evaluation of patients at risk for COVID-19 are in a state of rapid change based on information released by regulatory bodies including the CDC and federal and state organizations. These policies and algorithms were followed during the patient's care in the ED.  Left lower quadrant abdominal discomfort with associated diarrhea. Mild discomfort to palpation without evidence of peritonitis Patient declined pelvic exam and chose to self swab.  Labs grossly reassuring without leukocytosis.  Stable hemoglobin. No significant electrolyte derangements or renal insufficiency.  No evidence of biliary obstruction or pancreatitis. UA without evidence of infection or hematuria concerning for stone. Beta-hCG negative. Wet prep negative for bacterial vaginosis or trichomonas. GC/chlamydia sent.  Low suspicion for serious intra-abdominal inflammatory/infectious process such as diverticulitis.  Doubt torsion.     Final Clinical Impression(s) / ED Diagnoses Final diagnoses:  LLQ pain   The patient appears reasonably screened and/or stabilized for discharge and I doubt any other medical condition or other [facility] requiring further screening, evaluation, or treatment in the ED at this time prior to discharge. Safe for discharge with strict return precautions.  Disposition: Discharge  Condition: Good  I have  discussed the results, Dx and Tx plan with the patient/family who expressed understanding and agree(s) with the plan. Discharge instructions discussed at length. The patient/family was given strict return precautions who verbalized understanding of the instructions. No further questions at time of discharge.    ED Discharge Orders    None        Follow Up: Medicine, [facility] [address] [place] [address] [phone]  Call  in 3-5 days, If symptoms do not improve or  worsen      This chart was dictated using voice recognition software.  Despite best  efforts to proofread,  errors can occur which can change the documentation meaning.   Nira Conn, MD 05/12/20 [phone]

## 2020-05-12 NOTE — Discharge Instructions (Addendum)

## 2020-05-13 LAB — GC/CHLAMYDIA PROBE AMP (~~LOC~~) NOT AT ARMC
Chlamydia: NEGATIVE
Comment: NEGATIVE
Comment: NORMAL
Neisseria Gonorrhea: NEGATIVE

## 2020-10-31 ENCOUNTER — Other Ambulatory Visit: Payer: Self-pay

## 2020-10-31 ENCOUNTER — Emergency Department (HOSPITAL_COMMUNITY)
Admission: EM | Admit: 2020-10-31 | Discharge: 2020-10-31 | Disposition: A | Discharge status: Home / Self Care | Payer: Self-pay | Attending: Emergency Medicine | Admitting: Emergency Medicine

## 2020-10-31 ENCOUNTER — Encounter (HOSPITAL_COMMUNITY): Payer: Self-pay | Admitting: Emergency Medicine

## 2020-10-31 ENCOUNTER — Emergency Department (HOSPITAL_COMMUNITY): Payer: Self-pay

## 2020-10-31 DIAGNOSIS — Z20822 Contact with and (suspected) exposure to covid-19: Secondary | ICD-10-CM | POA: Insufficient documentation

## 2020-10-31 DIAGNOSIS — J069 Acute upper respiratory infection, unspecified: Secondary | ICD-10-CM | POA: Insufficient documentation

## 2020-10-31 DIAGNOSIS — Z28310 Unvaccinated for covid-19: Secondary | ICD-10-CM | POA: Insufficient documentation

## 2020-10-31 DIAGNOSIS — J45909 Unspecified asthma, uncomplicated: Secondary | ICD-10-CM | POA: Insufficient documentation

## 2020-10-31 LAB — RESP PANEL BY RT-PCR (FLU A&B, COVID) ARPGX2
Influenza A by PCR: NEGATIVE
Influenza B by PCR: NEGATIVE
SARS Coronavirus 2 by RT PCR: NEGATIVE

## 2020-10-31 NOTE — ED Provider Notes (Signed)
[facility]   CSN: [id number] Arrival date & time: 10/31/20  4196     History Chief Complaint  Patient presents with  . Cough    Melissa Ramirez is a 25 y.o. female.  Patient presents chief complaint of cough ongoing for a week.  She denies any fevers denies any shortness of breath denies any chest pain.  She states that her significant other also had a cough about a week prior to this.  She is not vaccinated for COVID otherwise.  She has an episode of vomiting when she coughs too much but otherwise no persistent vomiting per patient.        Past Medical History:  Diagnosis Date  . Anxiety   . Asthma   . Obesity   . PCOS (polycystic ovarian syndrome)     Patient Active Problem List   Diagnosis Date Noted  . Symptomatic anemia 09/13/2019  . Obesity 02/21/2014  . PCOS (polycystic ovarian syndrome) 02/21/2014  . Asthma 02/21/2014  . Fibrocystic breast changes of both breasts 02/21/2014    Past Surgical History:  Procedure Laterality Date  . FOOT SURGERY  2009  . TONSILLECTOMY    . tubes in ears       OB History    Gravida  0   Para  0   Term  0   Preterm  0   AB  0   Living  0     SAB  0   IAB  0   Ectopic  0   Multiple  0   Live Births              Family History  Problem Relation Age of Onset  . Hypertension Other   . Diabetes Other     Social History   Tobacco Use  . Smoking status: Never Smoker  . Smokeless tobacco: Never Used  Substance Use Topics  . Alcohol use: No  . Drug use: No    Home Medications Prior to Admission medications   Medication Sig Start Date End Date Taking? Authorizing Provider  drospirenone-ethinyl estradiol (YAZ) 3-0.02 MG tablet Take 1 tablet by mouth daily. 11/19/19   Malachy Chamber, MD  ferrous sulfate 325 (65 FE) MG tablet Take 1 tablet (325 mg total) by mouth daily. 09/20/19   Mannie Stabile, PA-C  promethazine-dextromethorphan  (PROMETHAZINE-DM) 6.25-15 MG/5ML syrup Take 5 mLs by mouth 4 (four) times daily as needed for cough. 01/31/20   Eustace Moore, MD  albuterol (PROVENTIL HFA;VENTOLIN HFA) 108 (90 BASE) MCG/ACT inhaler Inhale 1-2 puffs into the lungs every 6 (six) hours as needed for wheezing or shortness of breath. Patient not taking: Reported on 11/22/2016 08/13/14 06/05/19  Marlon Pel, PA-C  cetirizine (ZYRTEC ALLERGY) 10 MG tablet Take 1 tablet (10 mg total) by mouth daily. Patient not taking: Reported on 11/22/2016 04/03/16 06/05/19  Everlene Farrier, PA-C  fluticasone [facility]) 50 MCG/ACT nasal spray Place 1 spray into both nostrils daily. Patient not taking: Reported on 11/22/2016 04/03/16 06/05/19  Everlene Farrier, PA-C    Allergies    Morphine and related and Oxycodone  Review of Systems   Review of Systems  Constitutional: Negative for fever.  HENT: Negative for ear pain.   Eyes: Negative for pain.  Respiratory: Positive for cough.   Cardiovascular: Negative for chest pain.  Gastrointestinal: Negative for abdominal pain.  Genitourinary: Negative for flank pain.  Musculoskeletal: Negative for back pain.  Skin: Negative for rash.  Neurological: Negative for headaches.    Physical Exam Updated Vital Signs BP (!) 145/89 (BP Location: Left Arm)   Pulse 84   Temp 98.6 F (37 C) (Oral)   Resp 20   Ht 5\' 7"  (1.702 m)   Wt (!) 138.3 kg   LMP 10/31/2020   SpO2 100%   BMI 47.77 kg/m   Physical Exam Constitutional:      General: She is not in acute distress.    Appearance: Normal appearance.  HENT:     Head: Normocephalic.     Nose: Nose normal.  Eyes:     Extraocular Movements: Extraocular movements intact.  Cardiovascular:     Rate and Rhythm: Normal rate.  Pulmonary:     Effort: Pulmonary effort is normal.  Musculoskeletal:        General: Normal range of motion.     Cervical back: Normal range of motion.  Neurological:     General: No focal deficit present.     Mental  Status: She is alert. Mental status is at baseline.     ED Results / Procedures / Treatments   Labs (all labs ordered are listed, but only abnormal results are displayed) Labs Reviewed  RESP PANEL BY RT-PCR (FLU A&B, COVID) [id number]    EKG None  Radiology DG Chest [facility] 1 View  Result Date: 10/31/2020 CLINICAL DATA:  Productive cough. EXAM: PORTABLE CHEST 1 VIEW COMPARISON:  June 29, 2019. FINDINGS: The heart size and mediastinal contours are within normal limits. Both lungs are clear. The visualized skeletal structures are unremarkable. IMPRESSION: No active disease. Electronically Signed   By: July 01, 2019 M.D.   On: 10/31/2020 08:52    Procedures Procedures   Medications Ordered in ED Medications - No data to display  ED Course  I have reviewed the triage vital signs and the nursing notes.  Pertinent labs & imaging results that were available during my care of the patient were reviewed by me and considered in my medical decision making (see chart for details).    MDM Rules/Calculators/A&P                          Patient presents with cough ongoing for a week.  Vital signs within normal limits O2 saturation normal at 90% room air which is appropriate. Chest x-ray is unremarkable COVID test is negative.  Suspect other viral illness given work-up today.  Recommended cough drops and takes morphine at home as needed.  Advised immediate return for worsening symptoms or any additional concerns, advised her to call her doctor within 3 to 4 days for follow-up.  Final Clinical Impression(s) / ED Diagnoses Final diagnoses:  Viral upper respiratory tract infection    Rx / DC Orders ED Discharge Orders    None       11/02/2020, MD 10/31/20 [phone]

## 2020-10-31 NOTE — Discharge Instructions (Addendum)
COVID test was negative, and your chest x-ray showed no signs of pneumonia.  I suspect a viral upper respiratory infection which may run its course over the next week or so.  Call your primary care doctor or specialist as discussed in the next 2-3 days.   Return immediately back to the ER if:  Your symptoms worsen within the next 12-24 hours. You develop new symptoms such as new fevers, persistent vomiting, new pain, shortness of breath, or new weakness or numbness, or if you have any other concerns.

## 2020-10-31 NOTE — ED Triage Notes (Signed)
Pt arrived via walk in, c/o productive cough x1 week. Statesa coughing fits so ahrd she has thrown up, diarrhea.. Denies any fever/chills, body aches or any other issues. States SO has been sick with similar sx, since resolved.

## 2021-02-04 ENCOUNTER — Emergency Department (HOSPITAL_COMMUNITY)
Admission: EM | Admit: 2021-02-04 | Discharge: 2021-02-04 | Disposition: A | Discharge status: Home / Self Care | Payer: Medicaid Other | Attending: Emergency Medicine | Admitting: Emergency Medicine

## 2021-02-04 ENCOUNTER — Other Ambulatory Visit: Payer: Self-pay

## 2021-02-04 DIAGNOSIS — Z20822 Contact with and (suspected) exposure to covid-19: Secondary | ICD-10-CM | POA: Insufficient documentation

## 2021-02-04 DIAGNOSIS — J069 Acute upper respiratory infection, unspecified: Secondary | ICD-10-CM | POA: Insufficient documentation

## 2021-02-04 DIAGNOSIS — J45909 Unspecified asthma, uncomplicated: Secondary | ICD-10-CM | POA: Insufficient documentation

## 2021-02-04 DIAGNOSIS — U071 COVID-19: Secondary | ICD-10-CM | POA: Insufficient documentation

## 2021-02-04 LAB — RESP PANEL BY RT-PCR (FLU A&B, COVID) ARPGX2
Influenza A by PCR: NEGATIVE
Influenza B by PCR: NEGATIVE
SARS Coronavirus 2 by RT PCR: POSITIVE — AB

## 2021-02-04 MED ORDER — BENZONATATE 100 MG PO CAPS: 100.0000 mg | ORAL_CAPSULE | Freq: Three times a day (TID) | ORAL | 0 refills | Status: DC

## 2021-02-04 MED ORDER — ACETAMINOPHEN 325 MG PO TABS
650.0000 mg | ORAL_TABLET | Freq: Once | ORAL | Status: AC
  Administered 2021-02-04: 650 mg via ORAL
  Filled 2021-02-04: qty 2

## 2021-02-04 NOTE — ED Provider Notes (Addendum)
[facility]-EMERGENCY DEPT Provider Note   CSN: [id number] Arrival date & time: 02/04/21  1314     History Chief Complaint  Patient presents with   Sore Throat   Cough    Melissa Ramirez is a 25 y.o. female the past medical history of asthma and anxiety who presented today with a complaint of cough and sore throat.  This weekend she went out with some friends and then on Monday she began to have a sore throat and a cough.  Today she presents with the same.  Says that her highest temperature at home has been 99.7.  No sick contacts to her knowledge.  Denies chest pain or shortness of breath.   Past Medical History:  Diagnosis Date   Anxiety    Asthma    Obesity    PCOS (polycystic ovarian syndrome)     Patient Active Problem List   Diagnosis Date Noted   Symptomatic anemia 09/13/2019   Obesity 02/21/2014   PCOS (polycystic ovarian syndrome) 02/21/2014   Asthma 02/21/2014   Fibrocystic breast changes of both breasts 02/21/2014    Past Surgical History:  Procedure Laterality Date   FOOT SURGERY  2009   TONSILLECTOMY     tubes in ears       OB History     Gravida  0   Para  0   Term  0   Preterm  0   AB  0   Living  0      SAB  0   IAB  0   Ectopic  0   Multiple  0   Live Births              Family History  Problem Relation Age of Onset   Hypertension Other    Diabetes Other     Social History   Tobacco Use   Smoking status: Never   Smokeless tobacco: Never  Substance Use Topics   Alcohol use: No   Drug use: No    Home Medications Prior to Admission medications   Medication Sig Start Date End Date Taking? Authorizing Provider  drospirenone-ethinyl estradiol (YAZ) 3-0.02 MG tablet Take 1 tablet by mouth daily. 11/19/19   Malachy Chamber, MD  ferrous sulfate 325 (65 FE) MG tablet Take 1 tablet (325 mg total) by mouth daily. 09/20/19   Mannie Stabile, PA-C  promethazine-dextromethorphan (PROMETHAZINE-DM)  6.25-15 MG/5ML syrup Take 5 mLs by mouth 4 (four) times daily as needed for cough. 01/31/20   Eustace Moore, MD  albuterol (PROVENTIL HFA;VENTOLIN HFA) 108 (90 BASE) MCG/ACT inhaler Inhale 1-2 puffs into the lungs every 6 (six) hours as needed for wheezing or shortness of breath. Patient not taking: Reported on 11/22/2016 08/13/14 06/05/19  Marlon Pel, PA-C  cetirizine (ZYRTEC ALLERGY) 10 MG tablet Take 1 tablet (10 mg total) by mouth daily. Patient not taking: Reported on 11/22/2016 04/03/16 06/05/19  Everlene Farrier, PA-C  fluticasone [facility]) 50 MCG/ACT nasal spray Place 1 spray into both nostrils daily. Patient not taking: Reported on 11/22/2016 04/03/16 06/05/19  Everlene Farrier, PA-C    Allergies    Morphine and related and Oxycodone  Review of Systems   Review of Systems  Constitutional:  Negative for chills and fever.  HENT:  Positive for sore throat.   Respiratory:  Positive for cough. Negative for shortness of breath.   Cardiovascular:  Negative for chest pain.  All other systems reviewed and are negative.  Physical Exam Updated  Vital Signs BP (!) 141/99 (BP Location: Left Arm)   Pulse 94   Temp 99.7 F (37.6 C) (Oral)   Resp 18   LMP 01/19/2021   SpO2 92%   Physical Exam Vitals and nursing note reviewed.  Constitutional:      Appearance: Normal appearance.  HENT:     Head: Normocephalic and atraumatic.  Eyes:     General: No scleral icterus.    Conjunctiva/sclera: Conjunctivae normal.  Cardiovascular:     Rate and Rhythm: Normal rate and regular rhythm.  Pulmonary:     Effort: Pulmonary effort is normal. No respiratory distress.  Musculoskeletal:     Cervical back: Normal range of motion.  Skin:    General: Skin is warm and dry.     Findings: No rash.  Neurological:     Mental Status: She is alert.  Psychiatric:        Mood and Affect: Mood normal.    ED Results / Procedures / Treatments   Labs (all labs ordered are listed, but only abnormal  results are displayed) Labs Reviewed  RESP PANEL BY RT-PCR (FLU A&B, COVID) ARPGX2    EKG None  Radiology No results found.  Procedures Procedures   Medications Ordered in ED Medications - No data to display  ED Course  I have reviewed the triage vital signs and the nursing notes.  Pertinent labs & imaging results that were available during my care of the patient were reviewed by me and considered in my medical decision making (see chart for details). MDM Rules/Calculators/A&P SIONA Ramirez is a 25 y.o. female the past medical history of asthma and anxiety who presented today with a complaint of cough and sore throat.  This weekend she went out with some friends and then on Monday she began to have a sore throat and a cough.  Today she presents with the same.  Patient presented in no acute distress.  There are no signs of PTA or other abscess in the mouth. Her lung sounds are clear and I am not concerned for any pneumonia.  I suspect this to be an uncomplicated URI, potentially COVID.  Wells score for PE places the patient in low risk category.  Patient denying pleuritic chest pain, hemoptysis, leg swelling, shortness of breath.  Endorses OCP use "occasionally."  No recent travel or history of DVT.  I have a low suspicion for PE.  Patient educated on the possible signs and symptoms and return precautions.  Vital signs are stable and patient with no side effects of emergent condition.  Appears hemodynamically stable and agreeable to discharge.  Return precautions discussed at length.  Also attached to discharge papers.  Patient requesting something for her cough.  I we will send Jerilynn Som to her pharmacy.  Stable for discharge.   Final Clinical Impression(s) / ED Diagnoses Final diagnoses:  Viral upper respiratory tract infection    Rx / DC Orders Results and diagnoses were explained to the patient. Return precautions discussed in full. Patient had no additional questions and  expressed complete understanding.     [name], PA-C 02/04/21 1814    Woodroe Chen 02/04/21 Marcha Solders, MD 02/04/21 2350

## 2021-02-04 NOTE — ED Triage Notes (Signed)
Pt complains of sore throat, cough x 3 days. Denies fever or diarrhea.

## 2021-02-04 NOTE — ED Provider Notes (Signed)
Emergency Medicine Provider Triage Evaluation Note  Melissa Ramirez , a 25 y.o. female  was evaluated in triage.  Pt complains of cough and sore throat since Monday.  She went out this weekend and began to feel better on Monday. Temp at home 99.7  Review of Systems  Positive: Cough, sore throat Negative: Fevers, chills, chest pain, shortness of breath  Physical Exam  BP (!) 150/114   Pulse 94   Temp 99.7 F (37.6 C) (Oral)   Resp 18   LMP 01/19/2021   SpO2 99%  Gen:   Awake, no distress   Resp:  Normal effort  MSK:   Moves extremities without difficulty  Other:  Nothing visible in the throat  Medical Decision Making  Medically screening exam initiated at 2:00 PM.  Appropriate orders placed.  Wendy Poet was informed that the remainder of the evaluation will be completed by another provider, this initial triage assessment does not replace that evaluation, and the importance of remaining in the ED until their evaluation is complete.     Woodroe Chen 02/04/21 1401    Cheryll Cockayne, MD 02/09/21 [phone]

## 2021-02-04 NOTE — Discharge Instructions (Addendum)
Your COVID test will not come back today.  You can see the results in your portal.  If your tests are positive someone will call you.

## 2021-05-03 ENCOUNTER — Other Ambulatory Visit: Payer: Self-pay

## 2021-05-03 ENCOUNTER — Encounter (HOSPITAL_COMMUNITY): Payer: Self-pay

## 2021-05-03 ENCOUNTER — Emergency Department (HOSPITAL_COMMUNITY)
Admission: EM | Admit: 2021-05-03 | Discharge: 2021-05-04 | Disposition: A | Discharge status: Home / Self Care | Payer: Medicaid Other | Attending: Emergency Medicine | Admitting: Emergency Medicine

## 2021-05-03 DIAGNOSIS — Z20822 Contact with and (suspected) exposure to covid-19: Secondary | ICD-10-CM | POA: Insufficient documentation

## 2021-05-03 DIAGNOSIS — J069 Acute upper respiratory infection, unspecified: Secondary | ICD-10-CM | POA: Insufficient documentation

## 2021-05-03 DIAGNOSIS — J45909 Unspecified asthma, uncomplicated: Secondary | ICD-10-CM | POA: Insufficient documentation

## 2021-05-03 NOTE — ED Triage Notes (Signed)
Pt reports with cough and generalized body aches since today.

## 2021-05-03 NOTE — ED Provider Notes (Signed)
[facility]-EMERGENCY DEPT Provider Note   CSN: [id number] Arrival date & time: 05/03/21  2032     History Chief Complaint  Patient presents with   Cough   Generalized Body Aches    Melissa Ramirez is a 25 y.o. female.  25 year old female with a history of PCOS, asthma, obesity presents to the emergency department for evaluation of cough and sore throat.  She has had generalized body aches as well associated with her symptoms.  Notes onset of her symptoms this morning which have been constant, mild.  She has not taken any medications for her symptoms.  Reports that she is only in the ED because she came with her boyfriend who is also sick with similar complaints and she wanted to be checked as well.  No fevers, vomiting, diarrhea, chest pain, abdominal pain.  The history is provided by the patient. No language interpreter was used.  Cough     Past Medical History:  Diagnosis Date   Anxiety    Asthma    Obesity    PCOS (polycystic ovarian syndrome)     Patient Active Problem List   Diagnosis Date Noted   Symptomatic anemia 09/13/2019   Obesity 02/21/2014   PCOS (polycystic ovarian syndrome) 02/21/2014   Asthma 02/21/2014   Fibrocystic breast changes of both breasts 02/21/2014    Past Surgical History:  Procedure Laterality Date   FOOT SURGERY  2009   TONSILLECTOMY     tubes in ears       OB History     Gravida  0   Para  0   Term  0   Preterm  0   AB  0   Living  0      SAB  0   IAB  0   Ectopic  0   Multiple  0   Live Births              Family History  Problem Relation Age of Onset   Hypertension Other    Diabetes Other     Social History   Tobacco Use   Smoking status: Never   Smokeless tobacco: Never  Substance Use Topics   Alcohol use: No   Drug use: No    Home Medications Prior to Admission medications   Medication Sig Start Date End Date Taking? Authorizing Provider  benzonatate (TESSALON) 100 MG  capsule Take 1 capsule (100 mg total) by mouth 3 (three) times daily. 02/04/21   Redwine, Madison A, PA-C  drospirenone-ethinyl estradiol (YAZ) 3-0.02 MG tablet Take 1 tablet by mouth daily. 11/19/19   Malachy Chamber, MD  ferrous sulfate 325 (65 FE) MG tablet Take 1 tablet (325 mg total) by mouth daily. 09/20/19   Mannie Stabile, PA-C  promethazine-dextromethorphan (PROMETHAZINE-DM) 6.25-15 MG/5ML syrup Take 5 mLs by mouth 4 (four) times daily as needed for cough. 01/31/20   Eustace Moore, MD  albuterol (PROVENTIL HFA;VENTOLIN HFA) 108 (90 BASE) MCG/ACT inhaler Inhale 1-2 puffs into the lungs every 6 (six) hours as needed for wheezing or shortness of breath. Patient not taking: Reported on 11/22/2016 08/13/14 06/05/19  Marlon Pel, PA-C  cetirizine (ZYRTEC ALLERGY) 10 MG tablet Take 1 tablet (10 mg total) by mouth daily. Patient not taking: Reported on 11/22/2016 04/03/16 06/05/19  Everlene Farrier, PA-C  fluticasone [facility]) 50 MCG/ACT nasal spray Place 1 spray into both nostrils daily. Patient not taking: Reported on 11/22/2016 04/03/16 06/05/19  Everlene Farrier, PA-C  Allergies    Morphine and related and Oxycodone  Review of Systems   Review of Systems  Respiratory:  Positive for cough.   Ten systems reviewed and are negative for acute change, except as noted in the HPI.    Physical Exam Updated Vital Signs BP 114/83   Pulse 97   Temp 99 F (37.2 C) (Oral)   Resp 20   Ht 5\' 7"  (1.702 m)   Wt (!) 140.6 kg   SpO2 100%   BMI 48.55 kg/m   Physical Exam Vitals and nursing note reviewed.  Constitutional:      General: She is not in acute distress.    Appearance: She is well-developed. She is not diaphoretic.     Comments: Obese AA female. Pleasant and nontoxic.  HENT:     Head: Normocephalic and atraumatic.     Mouth/Throat:     Comments: Clear posterior oropharynx with minimal erythema.  No exudates, edema.  Uvula midline.  Tolerating secretions without  difficulty.  No tripoding. Eyes:     General: No scleral icterus.    Conjunctiva/sclera: Conjunctivae normal.  Pulmonary:     Effort: Pulmonary effort is normal. No respiratory distress.     Breath sounds: No stridor. No wheezing.     Comments: Respirations even and unlabored Musculoskeletal:        General: Normal range of motion.     Cervical back: Normal range of motion.  Skin:    General: Skin is warm and dry.     Coloration: Skin is not pale.     Findings: No erythema or rash.  Neurological:     Mental Status: She is alert and oriented to person, place, and time.     Coordination: Coordination normal.  Psychiatric:        Behavior: Behavior normal.    ED Results / Procedures / Treatments   Labs (all labs ordered are listed, but only abnormal results are displayed) Labs Reviewed  RESP PANEL BY RT-PCR (FLU A&B, COVID) ARPGX2    EKG None  Radiology No results found.  Procedures Procedures   Medications Ordered in ED Medications - No data to display  ED Course  I have reviewed the triage vital signs and the nursing notes.  Pertinent labs & imaging results that were available during my care of the patient were reviewed by me and considered in my medical decision making (see chart for details).    MDM Rules/Calculators/A&P                           Patient's symptoms are consistent with URI, likely viral etiology. Discussed that antibiotics are not indicated for viral infections. Patient will be discharged with symptomatic treatment.  She verbalizes understanding and is agreeable with plan. Patient is hemodynamically stable and in NAD prior to discharge.   Final Clinical Impression(s) / ED Diagnoses Final diagnoses:  Upper respiratory tract infection, unspecified type    Rx / DC Orders ED Discharge Orders     None        , PA-C 05/04/21 0111    Mesner, 05/06/21, MD 05/04/21 0505

## 2021-05-04 LAB — RESP PANEL BY RT-PCR (FLU A&B, COVID) ARPGX2
Influenza A by PCR: NEGATIVE
Influenza B by PCR: NEGATIVE
SARS Coronavirus 2 by RT PCR: NEGATIVE

## 2021-05-04 NOTE — Discharge Instructions (Signed)
You were negative today for influenza and COVID.  We recommend over-the-counter medications for symptom management.  You may take Tylenol or ibuprofen for pain.  Follow-up with your primary care doctor.

## 2021-09-07 ENCOUNTER — Ambulatory Visit (INDEPENDENT_AMBULATORY_CARE_PROVIDER_SITE_OTHER): Payer: Self-pay | Admitting: Medical

## 2021-09-07 ENCOUNTER — Encounter: Payer: Self-pay | Admitting: Medical

## 2021-09-07 ENCOUNTER — Other Ambulatory Visit (HOSPITAL_COMMUNITY)
Admission: RE | Admit: 2021-09-07 | Discharge: 2021-09-07 | Disposition: A | Discharge status: Home / Self Care | Payer: Medicaid Other | Source: Ambulatory Visit | Attending: Medical | Admitting: Medical

## 2021-09-07 VITALS — BP 126/89 | HR 88 | Wt 294.0 lb

## 2021-09-07 DIAGNOSIS — D500 Iron deficiency anemia secondary to blood loss (chronic): Secondary | ICD-10-CM

## 2021-09-07 DIAGNOSIS — Z8619 Personal history of other infectious and parasitic diseases: Secondary | ICD-10-CM

## 2021-09-07 DIAGNOSIS — E282 Polycystic ovarian syndrome: Secondary | ICD-10-CM

## 2021-09-07 DIAGNOSIS — Z113 Encounter for screening for infections with a predominantly sexual mode of transmission: Secondary | ICD-10-CM | POA: Insufficient documentation

## 2021-09-07 DIAGNOSIS — D649 Anemia, unspecified: Secondary | ICD-10-CM

## 2021-09-07 MED ORDER — METFORMIN HCL 500 MG PO TABS: 500.0000 mg | ORAL_TABLET | Freq: Two times a day (BID) | ORAL | 5 refills | Status: DC

## 2021-09-07 NOTE — Progress Notes (Signed)
Patient here due to abnormal menstrual bleeding. She informed me that she was bleeding from the December 4th to mid January. However, the bleeding did return in later January but has since resolved. ? ?Patient stated that she was put on Balcoltra which she was taking for about 3 weeks but then stopped due to her still having heavy bleeding ? ? ?

## 2021-09-07 NOTE — Progress Notes (Signed)
? ?History:  ?[name] is a 26 y.o. G0P0000 who presents to clinic today for abnormal uterine bleeding likely associated with previously diagnosed PCOS. The patient was seen here 11/19/19 and it was noted that she had some success on Metformin and Megace in the past, but it was decided to switch to [facility] for bleeding control at that time. She states that she only did one pack of OCPs and then stopped because she couldn't remember to take them on time. More recently she saw her PCP and was given Rx for Nuvaring which she states she cannot afford and then restarted OCPs again, but then discontinued again. She has tried Depo Provera in the past but discontinued due to weight gain. Recently she bled the entire month of December, stopped for a few weeks and then bled again in mid-January. She is not currently bleeding.   ? ?The following portions of the patient's history were reviewed and updated as appropriate: allergies, current medications, family history, past medical history, social history, past surgical history and problem list. ? ?Review of Systems:  ?Review of Systems  ?Constitutional:  Positive for malaise/fatigue. Negative for fever and weight loss.  ?Gastrointestinal:  Negative for abdominal pain.  ?Genitourinary:   ?     Neg - vaginal bleeding, discharge  ?Neurological:  Negative for dizziness and loss of consciousness.  ? ?  ?Objective:  ?Physical Exam ?BP 126/89   Pulse 88   Wt 294 lb (133.4 kg)   LMP 06/26/2021 (Approximate)   BMI 46.05 kg/m?  ?Physical Exam ?Vitals and nursing note reviewed.  ?Constitutional:   ?   General: She is not in acute distress. ?   Appearance: She is well-developed.  ?HENT:  ?   Head: Normocephalic and atraumatic.  ?Cardiovascular:  ?   Rate and Rhythm: Normal rate.  ?Pulmonary:  ?   Effort: Pulmonary effort is normal.  ?Abdominal:  ?   Palpations: Abdomen is soft.  ?   Tenderness: There is no abdominal tenderness.  ?Skin: ?   General: Skin is warm and dry.  ?    Findings: No erythema.  ?Neurological:  ?   Mental Status: She is alert and oriented to person, place, and time.  ? ? ?Health Maintenance Due  ?Topic Date Due  ? COVID-19 Vaccine (1) Never done  ? HPV VACCINES (1 - 2-dose series) Never done  ? Hepatitis C Screening  Never done  ? TETANUS/TDAP  Never done  ? PAP-Cervical Cytology Screening  Never done  ? PAP SMEAR-Modifier  Never done  ? ? ? ?Assessment & Plan:  ?1. Symptomatic anemia ?- CBC ? ?2. Screening for STDs (sexually transmitted diseases) ?- Hepatitis B Surface AntiGEN ?- Hepatitis C Antibody ?- HIV Antibody (routine testing w rflx) ?- RPR ?- Cervicovaginal ancillary only( [facility]) - self swab collected  ? ?3. PCOS (polycystic ovarian syndrome) ?- Discussed options for regulating periods and the need to ensure endometrium is protected if periods are not regular ?- Discussed all options for birth control and associated bleeding profiles ?- Also discussed option to consider Metformin again as she has had success with this in the past, patient prefers this treatment options, Rx sent today  ?- metFORMIN (GLUCOPHAGE) 500 MG tablet; Take 1 tablet (500 mg total) by mouth 2 (two) times daily with a meal.  Dispense: 60 tablet; Refill: 5 ?- Discussed OCPs for next steps if no periods  ?- Patient to return to [facility] in 3-6 month for follow-up ? ?  Approximately 30 minutes of total time was spent with this patient on chart review from previous visits, detailed history taking, patient education as noted above, coordination of care for labs and Rx, physical exam and documentation.  ? ?Marny Lowenstein, PA-C ?09/09/2021 ?12:02 PM ? ?

## 2021-09-08 LAB — CERVICOVAGINAL ANCILLARY ONLY
Chlamydia: NEGATIVE
Comment: NEGATIVE
Comment: NEGATIVE
Comment: NORMAL
Neisseria Gonorrhea: NEGATIVE
Trichomonas: NEGATIVE

## 2021-09-09 ENCOUNTER — Encounter: Payer: Self-pay | Admitting: Medical

## 2021-09-09 LAB — CBC
Hematocrit: 25.3 % — ABNORMAL LOW (ref 34.0–46.6)
Hemoglobin: 6.7 g/dL — CL (ref 11.1–15.9)
MCH: 15.6 pg — ABNORMAL LOW (ref 26.6–33.0)
MCHC: 26.5 g/dL — ABNORMAL LOW (ref 31.5–35.7)
MCV: 59 fL — ABNORMAL LOW (ref 79–97)
NRBC: 1 % — ABNORMAL HIGH (ref 0–0)
Platelets: 478 10*3/uL — ABNORMAL HIGH (ref 150–450)
RBC: 4.3 x10E6/uL (ref 3.77–5.28)
RDW: 20.9 % — ABNORMAL HIGH (ref 11.7–15.4)
WBC: 6.6 10*3/uL (ref 3.4–10.8)

## 2021-09-09 LAB — RPR: RPR Ser Ql: REACTIVE — AB

## 2021-09-09 LAB — RPR, QUANT+TP ABS (REFLEX)
Rapid Plasma Reagin, Quant: 1:1 {titer} — ABNORMAL HIGH
T Pallidum Abs: NONREACTIVE

## 2021-09-09 LAB — HEPATITIS C ANTIBODY: Hep C Virus Ab: NONREACTIVE

## 2021-09-09 LAB — HIV ANTIBODY (ROUTINE TESTING W REFLEX): HIV Screen 4th Generation wRfx: NONREACTIVE

## 2021-09-09 LAB — HEPATITIS B SURFACE ANTIGEN: Hepatitis B Surface Ag: NEGATIVE

## 2021-09-10 ENCOUNTER — Other Ambulatory Visit: Payer: Self-pay | Admitting: Medical

## 2021-09-10 ENCOUNTER — Telehealth: Payer: Self-pay | Admitting: *Deleted

## 2021-09-10 DIAGNOSIS — Z8619 Personal history of other infectious and parasitic diseases: Secondary | ICD-10-CM | POA: Insufficient documentation

## 2021-09-10 DIAGNOSIS — D649 Anemia, unspecified: Secondary | ICD-10-CM | POA: Insufficient documentation

## 2021-09-10 MED ORDER — SODIUM CHLORIDE 0.9 % IV SOLN: 150.0000 mg | INTRAVENOUS | Status: AC

## 2021-09-10 MED ORDER — FERROUS SULFATE 325 (65 FE) MG PO TABS: 325.0000 mg | ORAL_TABLET | ORAL | 11 refills | Status: DC

## 2021-09-10 MED ORDER — MEDROXYPROGESTERONE ACETATE 10 MG PO TABS: 10.0000 mg | ORAL_TABLET | Freq: Every day | ORAL | 3 refills | Status: DC

## 2021-09-10 NOTE — Addendum Note (Signed)
Addended by: Marny Lowenstein on: 09/10/2021 03:03 PM ? ? Modules accepted: Orders ? ?

## 2021-09-10 NOTE — Telephone Encounter (Addendum)
-----   Message from Marny Lowenstein, PA-C sent at 09/10/2021  3:26 PM EDT ----- ?Please schedule patient for Venofer infusion ASAP.  ? ?Thank you.  ? ?4/6  1615  Called pt to inform of Venofer infusion scheduled on 4/24 @ 10:00 am [facility] infusion center. She did not answer. Per chart review, pt has seen message from [facility] regarding test results and need for infusion appointment. I left a message stating that I have scheduled and appointment for her and to please check her Mychart. She may send message back if she has questions or leave message on nurse voicemail.  ?

## 2021-09-10 NOTE — Addendum Note (Signed)
Addended by: Luvenia Redden on: 09/10/2021 03:26 PM ? ? Modules accepted: Orders ? ?

## 2021-09-28 ENCOUNTER — Non-Acute Institutional Stay (HOSPITAL_COMMUNITY)
Admission: RE | Admit: 2021-09-28 | Discharge: 2021-09-28 | Disposition: A | Discharge status: Home / Self Care | Payer: Medicaid Other | Source: Ambulatory Visit | Attending: Internal Medicine | Admitting: Internal Medicine

## 2021-09-28 DIAGNOSIS — D649 Anemia, unspecified: Secondary | ICD-10-CM | POA: Insufficient documentation

## 2021-09-28 MED ORDER — SODIUM CHLORIDE 0.9 % IV SOLN: INTRAVENOUS | Status: DC | PRN

## 2021-09-28 MED ORDER — SODIUM CHLORIDE 0.9 % IV SOLN
150.0000 mg | Freq: Once | INTRAVENOUS | Status: AC
  Administered 2021-09-28: 150 mg via INTRAVENOUS
  Filled 2021-09-28: qty 7.5

## 2021-09-28 NOTE — Progress Notes (Signed)
PATIENT CARE CENTER NOTE ? ? ?Diagnosis: Symptomatic Anemia  ? ? ?Provider: Vonzella Nipple, PA ? ? ?Procedure: Venofer infusion  ? ? ?Note:  Patient received Venofer 150 mg infusion (dose 1 of 2) via PIV. Tolerated well with no adverse reaction. Vital signs stable. Observed patient for 30 minutes post infusion. Discharge instructions given. Patient scheduled to come back next week on 10/09/2021 for second infusion. Alert, oriented and ambulatory at discharge.   ?

## 2021-10-09 ENCOUNTER — Non-Acute Institutional Stay (HOSPITAL_COMMUNITY)
Admission: RE | Admit: 2021-10-09 | Discharge: 2021-10-09 | Disposition: A | Discharge status: Home / Self Care | Payer: Medicaid Other | Source: Ambulatory Visit | Attending: Internal Medicine | Admitting: Internal Medicine

## 2021-10-09 DIAGNOSIS — D649 Anemia, unspecified: Secondary | ICD-10-CM | POA: Insufficient documentation

## 2021-10-09 MED ORDER — SODIUM CHLORIDE 0.9 % IV SOLN
150.0000 mg | Freq: Once | INTRAVENOUS | Status: AC
  Administered 2021-10-09: 150 mg via INTRAVENOUS
  Filled 2021-10-09: qty 7.5

## 2021-10-09 MED ORDER — SODIUM CHLORIDE 0.9 % IV SOLN: INTRAVENOUS | Status: DC | PRN

## 2021-10-09 NOTE — Progress Notes (Signed)
PATIENT CARE CENTER NOTE ?  ?  ?Diagnosis: Symptomatic Anemia  ?  ?  ?Provider: Vonzella Nipple, PA ?  ?  ?Procedure: Venofer infusion  ?  ?  ?Note:  Patient received Venofer 150 mg infusion (dose 2 of 2) via PIV. Tolerated well with no adverse reaction. Vital signs stable. Observed patient for 30 minutes post infusion. AVS offered but patient refused. Patient alert, oriented and ambulatory at discharge.  ?

## 2022-06-21 ENCOUNTER — Encounter (HOSPITAL_COMMUNITY): Payer: Self-pay

## 2022-06-21 ENCOUNTER — Emergency Department (HOSPITAL_COMMUNITY)
Admission: EM | Admit: 2022-06-21 | Discharge: 2022-06-21 | Disposition: A | Discharge status: Home / Self Care | Payer: Medicaid Other | Attending: Emergency Medicine | Admitting: Emergency Medicine

## 2022-06-21 ENCOUNTER — Other Ambulatory Visit: Payer: Self-pay

## 2022-06-21 DIAGNOSIS — N939 Abnormal uterine and vaginal bleeding, unspecified: Secondary | ICD-10-CM | POA: Insufficient documentation

## 2022-06-21 DIAGNOSIS — Z5321 Procedure and treatment not carried out due to patient leaving prior to being seen by health care provider: Secondary | ICD-10-CM | POA: Diagnosis not present

## 2022-06-21 NOTE — ED Triage Notes (Signed)
Patient said she has had vaginal bleeding for a month. Going through 3-4 pads a day. Cramping.

## 2022-06-21 NOTE — ED Provider Triage Note (Signed)
Emergency Medicine Provider Triage Evaluation Note  Melissa Ramirez , a 27 y.o. female  was evaluated in triage.  Pt complains of heavy menstrual bleeding for the past month.  States she has been going through 3-4 pads per day.  States she has history of PCOS and does occasionally get heavy bleeding like this.  Last time was in July.  Between July and December she has not had any menstrual cycles or vaginal bleeding.  Denies any abdominal pain.   Review of Systems  Positive: As above Negative: As above  Physical Exam  BP (!) 144/100 (BP Location: Left Arm)   Pulse 88   Temp 98.2 F (36.8 C) (Oral)   Resp 19   Ht 5\' 5"  (1.651 m)   Wt (!) 138.3 kg   SpO2 100%   BMI 50.75 kg/m  Gen:   Awake, no distress   Resp:  Normal effort  MSK:   Moves extremities without difficulty  Other:  Abdomen soft, nontender and without distention  Medical Decision Making  Medically screening exam initiated at 3:30 PM.  Appropriate orders placed.  [name] was informed that the remainder of the evaluation will be completed by another provider, this initial triage assessment does not replace that evaluation, and the importance of remaining in the ED until their evaluation is complete.    Evlyn Courier, PA-C 06/21/22 1531

## 2022-06-22 ENCOUNTER — Emergency Department (HOSPITAL_COMMUNITY)
Admission: EM | Admit: 2022-06-22 | Discharge: 2022-06-22 | Discharge status: Against Medical Advice | Payer: Medicaid Other | Attending: Medical | Admitting: Medical

## 2022-06-22 DIAGNOSIS — Z5321 Procedure and treatment not carried out due to patient leaving prior to being seen by health care provider: Secondary | ICD-10-CM | POA: Diagnosis not present

## 2022-06-22 DIAGNOSIS — N939 Abnormal uterine and vaginal bleeding, unspecified: Secondary | ICD-10-CM | POA: Diagnosis present

## 2022-06-22 LAB — COMPREHENSIVE METABOLIC PANEL
ALT: 25 U/L (ref 0–44)
AST: 23 U/L (ref 15–41)
Albumin: 4.2 g/dL (ref 3.5–5.0)
Alkaline Phosphatase: 87 U/L (ref 38–126)
Anion gap: 10 (ref 5–15)
BUN: 14 mg/dL (ref 6–20)
CO2: 21 mmol/L — ABNORMAL LOW (ref 22–32)
Calcium: 9.1 mg/dL (ref 8.9–10.3)
Chloride: 105 mmol/L (ref 98–111)
Creatinine, Ser: 0.8 mg/dL (ref 0.44–1.00)
GFR, Estimated: >60 mL/min (ref >60)
Glucose, Bld: 106 mg/dL — ABNORMAL HIGH (ref 70–99)
Potassium: 4 mmol/L (ref 3.5–5.1)
Sodium: 136 mmol/L (ref 135–145)
Total Bilirubin: 0.5 mg/dL (ref 0.3–1.2)
Total Protein: 7.9 g/dL (ref 6.5–8.1)

## 2022-06-22 LAB — URINALYSIS, ROUTINE W REFLEX MICROSCOPIC
Bilirubin Urine: NEGATIVE
Glucose, UA: NEGATIVE mg/dL
Ketones, ur: NEGATIVE mg/dL
Leukocytes,Ua: NEGATIVE
Nitrite: NEGATIVE
Protein, ur: 30 mg/dL — AB
RBC / HPF: >50 RBC/hpf — ABNORMAL HIGH (ref 0–5)
Specific Gravity, Urine: 1.011 (ref 1.005–1.030)
pH: 6 (ref 5.0–8.0)

## 2022-06-22 LAB — CBC WITH DIFFERENTIAL/PLATELET
Abs Immature Granulocytes: 0.08 10*3/uL — ABNORMAL HIGH (ref 0.00–0.07)
Basophils Absolute: 0 10*3/uL (ref 0.0–0.1)
Basophils Relative: 0 %
Eosinophils Absolute: 0.2 10*3/uL (ref 0.0–0.5)
Eosinophils Relative: 3 %
HCT: 35.7 % — ABNORMAL LOW (ref 36.0–46.0)
Hemoglobin: 9.9 g/dL — ABNORMAL LOW (ref 12.0–15.0)
Immature Granulocytes: 1 %
Lymphocytes Relative: 24 %
Lymphs Abs: 1.9 10*3/uL (ref 0.7–4.0)
MCH: 19 pg — ABNORMAL LOW (ref 26.0–34.0)
MCHC: 27.7 g/dL — ABNORMAL LOW (ref 30.0–36.0)
MCV: 68.4 fL — ABNORMAL LOW (ref 80.0–100.0)
Monocytes Absolute: 0.3 10*3/uL (ref 0.1–1.0)
Monocytes Relative: 4 %
Neutro Abs: 5.2 10*3/uL (ref 1.7–7.7)
Neutrophils Relative %: 68 %
Platelets: 363 10*3/uL (ref 150–400)
RBC: 5.22 MIL/uL — ABNORMAL HIGH (ref 3.87–5.11)
RDW: 19.5 % — ABNORMAL HIGH (ref 11.5–15.5)
WBC: 7.7 10*3/uL (ref 4.0–10.5)
nRBC: 0 % (ref 0.0–0.2)

## 2022-06-22 LAB — HCG, QUANTITATIVE, PREGNANCY: hCG, Beta Chain, Quant, S: <1 m[IU]/mL (ref <5)

## 2022-06-22 LAB — PREGNANCY, URINE: Preg Test, Ur: NEGATIVE

## 2022-06-22 NOTE — ED Triage Notes (Signed)
C/o consistent heavy vaginal bleeding with 3-4/pads a day x1 month with suprapubic pain.  Hx of anemia with transfusion Denies blood thinner usage and birth control usage.  Denies dizziness, fatigue, sob.

## 2022-06-22 NOTE — ED Provider Triage Note (Signed)
Emergency Medicine Provider Triage Evaluation Note  Melissa Ramirez , a 27 y.o. female  was evaluated in triage.  Pt complains of heavy vaginal bleedingx1 month. Reports suprapubic pain today. Is not birth control. Denies dizziness, SOB. Going through 3-4 pads/day. Concerned for anemia.  Review of Systems  Positive: Vaginal bleeding Negative: SOB, dizziness  Physical Exam  BP (!) 145/98 (BP Location: Right Arm)   Pulse 83   Temp 98.3 F (36.8 C) (Oral)   Resp 18   SpO2 100%  Gen:   Awake, no distress   Resp:  Normal effort  MSK:   Moves extremities without difficulty  Other:  Mild suprapubic ttp  Medical Decision Making  Medically screening exam initiated at 9:54 AM.  Appropriate orders placed.  [name] was informed that the remainder of the evaluation will be completed by another provider, this initial triage assessment does not replace that evaluation, and the importance of remaining in the ED until their evaluation is complete.    Osvaldo Shipper, [place] 06/22/22 [phone]

## 2022-10-08 ENCOUNTER — Telehealth: Payer: Self-pay | Admitting: Medical

## 2022-10-08 DIAGNOSIS — E282 Polycystic ovarian syndrome: Secondary | ICD-10-CM

## 2022-10-11 ENCOUNTER — Telehealth: Payer: Self-pay

## 2022-10-11 NOTE — Telephone Encounter (Signed)
Pt called requesting a refill on her Provera that she was prescribed a year ago for breakthrough bleeding on birth control.    Melissa Ramirez  10/11/22

## 2022-10-12 NOTE — Telephone Encounter (Signed)
Called patient at number listed in chart--[phone]--sent to unidentified automated message stating that "caller is not able to take all at this time," no option to leave message.   If patient returns call; can we get her scheduled for an annual visit? She needs an annual visit in order for [place] to refill her Provera Rx--hasn't been seen since 09/10/21. Thanks!   Maureen Ralphs RN on 10/12/22 at [phone]

## 2022-10-13 NOTE — Telephone Encounter (Signed)
Patient called and left message on nurse voicemail line requesting a call back.   Called patient and she requests refill on provera as that has helped in the past. Patient reports hx of anemia and prolonged periods and has currently been bleeding for a month. Refilled provera x1 per Dr Alvester Morin. Discussed refill with patient and that she needs a follow up appt with [place] before additional refills can be sent. Advised someone from the front office will reach out to her with an appt. Patient verbalized understanding.

## 2022-10-13 NOTE — Addendum Note (Signed)
Addended by: Kathee Delton on: 10/13/2022 05:31 PM   Modules accepted: Orders

## 2022-10-26 ENCOUNTER — Ambulatory Visit: Payer: Medicaid Other | Admitting: Obstetrics and Gynecology

## 2022-11-15 ENCOUNTER — Encounter: Payer: Self-pay | Admitting: Obstetrics and Gynecology

## 2022-11-15 ENCOUNTER — Other Ambulatory Visit (HOSPITAL_COMMUNITY)
Admission: RE | Admit: 2022-11-15 | Discharge: 2022-11-15 | Disposition: A | Discharge status: Home / Self Care | Payer: Medicaid Other | Source: Ambulatory Visit | Attending: Obstetrics and Gynecology | Admitting: Obstetrics and Gynecology

## 2022-11-15 ENCOUNTER — Ambulatory Visit (INDEPENDENT_AMBULATORY_CARE_PROVIDER_SITE_OTHER): Payer: Medicaid Other | Admitting: Obstetrics and Gynecology

## 2022-11-15 ENCOUNTER — Other Ambulatory Visit: Payer: Self-pay

## 2022-11-15 VITALS — BP 129/83 | HR 90 | Ht 65.0 in | Wt 304.3 lb

## 2022-11-15 DIAGNOSIS — E282 Polycystic ovarian syndrome: Secondary | ICD-10-CM

## 2022-11-15 DIAGNOSIS — N926 Irregular menstruation, unspecified: Secondary | ICD-10-CM | POA: Insufficient documentation

## 2022-11-15 DIAGNOSIS — Z124 Encounter for screening for malignant neoplasm of cervix: Secondary | ICD-10-CM | POA: Diagnosis present

## 2022-11-15 DIAGNOSIS — L83 Acanthosis nigricans: Secondary | ICD-10-CM | POA: Insufficient documentation

## 2022-11-15 DIAGNOSIS — L309 Dermatitis, unspecified: Secondary | ICD-10-CM | POA: Insufficient documentation

## 2022-11-15 DIAGNOSIS — J309 Allergic rhinitis, unspecified: Secondary | ICD-10-CM | POA: Insufficient documentation

## 2022-11-15 MED ORDER — NORELGESTROMIN-ETH ESTRADIOL 150-35 MCG/24HR TD PTWK: 1.0000 | MEDICATED_PATCH | TRANSDERMAL | 12 refills | Status: DC

## 2022-11-15 NOTE — Progress Notes (Unsigned)
NEW GYNECOLOGY PATIENT Patient name: Melissa Ramirez MRN [id number]  Date of birth: 1995-12-31 Chief Complaint:   Metrorrhagia     History:  Melissa Ramirez is a 27 y.o. G0P0000 being seen today for AUB.    Stopped taking MTF due to GI side effects. Previously prescribed birth control pills but had a issue with keeping up to take them.  Didn't want to do depo, previously did depo when [age] which helped to regulate menses but had weight gain  Consider having children in the future but not any time soon LMP 5/3; menses prior to that was a few months prior Was prescribed provera to help stop the bleeding; while taking the pills, bleeding stopped and then resumed  CHC contraindications: none  Tried nuvaring previously - insurance didn't cover; and when tried it, it didn't feel right      Gynecologic History Patient's last menstrual period was 10/08/2022 (approximate). Contraception: none Last Pap: No results found for: "DIAGPAP", "HPVHIGH", "ADEQPAP" Last Mammogram: n/a Last Colonoscopy: n/a  Obstetric History OB History  Gravida Para Term Preterm AB Living  0 0 0 0 0 0  SAB IAB Ectopic Multiple Live Births  0 0 0 0      Past Medical History:  Diagnosis Date   Anxiety    Asthma    Obesity    PCOS (polycystic ovarian syndrome)     Past Surgical History:  Procedure Laterality Date   FOOT SURGERY  2009   TONSILLECTOMY     tubes in ears      Current Outpatient Medications on File Prior to Visit  Medication Sig Dispense Refill   ferrous sulfate (FERROUSUL) 325 (65 FE) MG tablet Take 1 tablet (325 mg total) by mouth every other day. 15 tablet 11   medroxyPROGESTERone (PROVERA) 10 MG tablet Take 1 tablet by mouth once daily 10 tablet 0   benzonatate (TESSALON) 100 MG capsule Take 1 capsule (100 mg total) by mouth 3 (three) times daily. 21 capsule 0   metFORMIN (GLUCOPHAGE) 500 MG tablet Take 1 tablet (500 mg total) by mouth 2 (two) times daily with a meal. (Patient not taking:  Reported on 11/15/2022) 60 tablet 5   [DISCONTINUED] albuterol (PROVENTIL HFA;VENTOLIN HFA) 108 (90 BASE) MCG/ACT inhaler Inhale 1-2 puffs into the lungs every 6 (six) hours as needed for wheezing or shortness of breath. (Patient not taking: Reported on 11/22/2016) 1 Inhaler 0   [DISCONTINUED] cetirizine (ZYRTEC ALLERGY) 10 MG tablet Take 1 tablet (10 mg total) by mouth daily. (Patient not taking: Reported on 11/22/2016) 30 tablet 1   No current facility-administered medications on file prior to visit.    Allergies  Allergen Reactions   Morphine And Codeine Itching   Morphine Sulfate    Oxycodone Itching    Social History:  reports that she has never smoked. She has never used smokeless tobacco. She reports that she does not drink alcohol and does not use drugs.  Family History  Problem Relation Age of Onset   Hypertension Other    Diabetes Other     The following portions of the patient's history were reviewed and updated as appropriate: allergies, current medications, past family history, past medical history, past social history, past surgical history and problem list.  Review of Systems Pertinent items noted in HPI and remainder of comprehensive ROS otherwise negative.  Physical Exam:  BP 129/83   Pulse 90   Ht 5\' 5"  (1.651 m)   Wt (!) 304 lb 4.8  oz (138 kg)   LMP 10/08/2022 (Approximate)   BMI 50.64 kg/m  Physical Exam Vitals and nursing note reviewed. Exam conducted with a chaperone present.  Constitutional:      Appearance: Normal appearance.  Cardiovascular:     Rate and Rhythm: Normal rate.  Pulmonary:     Effort: Pulmonary effort is normal.     Breath sounds: Normal breath sounds.  Genitourinary:    General: Normal vulva.     Vagina: Normal.     Cervix: Normal.  Neurological:     General: No focal deficit present.     Mental Status: She is alert and oriented to person, place, and time.  Psychiatric:        Mood and Affect: Mood normal.        Behavior:  Behavior normal.        Thought Content: Thought content normal.        Judgment: Judgment normal.        Assessment and Plan:   1. PCOS (polycystic ovarian syndrome) Given length of time between menses, pelvic US ordered to assess endometrial thickness.  Counseled patient about management of PCOS, recommended weight loss which helps with restoring ovulatory cycles, decreases glucose intolerance with improvement of metabolic risk, improves fertility/pregnancy rates and helps with overall health.  Even modest weight loss (5 to 10 percent reduction in body weight) in women with PCOS may result in these effects.  OCPs are also the mainstay of pharmacologic therapy for women with PCOS for managing hyperandrogenism and menstrual dysfunction and for providing contraception.  Will trial patch as was not able to keep up with daily pills previously. Discussed slight increase in VTE risk with patch and decision to proceed with shared decision making.   - US PELVIC COMPLETE WITH TRANSVAGINAL; Future - norelgestromin-ethinyl estradiol [employer]) 150-35 MCG/24HR transdermal patch; Place 1 patch onto the skin once a week.  Dispense: 3 patch; Refill: 12  2. Screening for cervical cancer Pap collected - Cytology - PAP( [place])    Routine preventative health maintenance measures emphasized. Please refer to After Visit Summary for other counseling recommendations.   Follow-up: No follow-ups on file.      Lorriane Shire, MD Obstetrician & Gynecologist, Faculty Practice Minimally Invasive Gynecologic Surgery [facility] for [employer], [facility]

## 2022-11-22 ENCOUNTER — Ambulatory Visit (HOSPITAL_COMMUNITY)
Admission: RE | Admit: 2022-11-22 | Discharge: 2022-11-22 | Disposition: A | Discharge status: Home / Self Care | Payer: Medicaid Other | Source: Ambulatory Visit | Attending: Obstetrics and Gynecology | Admitting: Obstetrics and Gynecology

## 2022-11-22 DIAGNOSIS — E282 Polycystic ovarian syndrome: Secondary | ICD-10-CM | POA: Insufficient documentation

## 2022-11-22 LAB — CYTOLOGY - PAP
Chlamydia: NEGATIVE
Comment: NEGATIVE
Comment: NEGATIVE
Comment: NEGATIVE
Comment: NORMAL
Diagnosis: UNDETERMINED — AB
High risk HPV: POSITIVE — AB
Neisseria Gonorrhea: NEGATIVE
Trichomonas: NEGATIVE

## 2022-11-24 ENCOUNTER — Telehealth: Payer: Self-pay | Admitting: Lactation Services

## 2022-11-24 NOTE — Telephone Encounter (Signed)
-----   Message from Lorriane Shire, MD sent at 11/22/2022  3:47 PM EDT ----- Please scheduled patient for colposcopy

## 2022-11-24 NOTE — Telephone Encounter (Signed)
Attempted to call patient to discuss results and follow up. Patient has seen message from Dr. Briscoe Deutscher and [facility] appointment has been scheduled. LM for patient to call the office at [phone] if she has any questions or concerns.

## 2022-12-08 ENCOUNTER — Other Ambulatory Visit (HOSPITAL_COMMUNITY): Admission: RE | Admit: 2022-12-08 | Payer: Medicaid Other | Source: Ambulatory Visit

## 2022-12-08 ENCOUNTER — Ambulatory Visit (INDEPENDENT_AMBULATORY_CARE_PROVIDER_SITE_OTHER): Payer: Medicaid Other | Admitting: Obstetrics and Gynecology

## 2022-12-08 ENCOUNTER — Encounter: Payer: Self-pay | Admitting: Obstetrics and Gynecology

## 2022-12-08 ENCOUNTER — Other Ambulatory Visit: Payer: Self-pay

## 2022-12-08 VITALS — BP 129/67 | HR 93 | Wt 301.6 lb

## 2022-12-08 DIAGNOSIS — R87610 Atypical squamous cells of undetermined significance on cytologic smear of cervix (ASC-US): Secondary | ICD-10-CM | POA: Insufficient documentation

## 2022-12-08 DIAGNOSIS — R87810 Cervical high risk human papillomavirus (HPV) DNA test positive: Secondary | ICD-10-CM

## 2022-12-08 LAB — POCT PREGNANCY, URINE: Preg Test, Ur: NEGATIVE

## 2022-12-08 NOTE — Progress Notes (Signed)
    GYNECOLOGY OFFICE COLPOSCOPY PROCEDURE NOTE  27 y.o. G0P0000 here for colposcopy for atypical squamous cellularity of undetermined significance (ASCUS) with HR HPV pap smear on 11/15/2022. Discussed role for HPV in cervical dysplasia, need for surveillance.  Patient gave informed written consent, time out was performed.  Placed in lithotomy position. Cervix viewed with speculum and colposcope after application of acetic acid.   Colposcopy adequate? Yes Small cervix  no punctation, no abnormal vasculature, acetowhite lesion(s) noted at 11 and 5 o'clock, and mosaicism noted at 1 o'clock; corresponding biopsies obtained.  ECC specimen obtained. All specimens were labeled and sent to pathology.  Chaperone was present during entire procedure.  Patient was given post procedure instructions.  Will follow up pathology and manage accordingly; patient will be contacted with results and recommendations.  Routine preventative health maintenance measures emphasized.   Lorriane Shire, MD, FACOG Minimally Invasive Gynecologic Surgery  Obstetrics and Gynecology, [facility] for [facility], [facility] 12/08/2022

## 2022-12-08 NOTE — Patient Instructions (Signed)
Colposcopy, Care After  The following information offers guidance on how to care for yourself after your procedure. Your doctor may also give you more specific instructions. If you have problems or questions, contact your doctor. What can I expect after the procedure? If you did not have a sample of your tissue taken out (did not have a biopsy), you may only have some spotting of blood for a few days. You can go back to your normal activities. If you had a sample of your tissue taken out, it is common to have: Soreness and mild pain. These may last for a few days. Mild bleeding or fluid (discharge) coming from your vagina. The fluid will look dark and grainy. You may have this for a few days. The fluid may be caused by a liquid that was used during your procedure. You may need to wear a sanitary pad. Spotting of blood for at least 48 hours after the procedure. Follow these instructions at home: Medicines Take over-the-counter and prescription medicines only as told by your doctor. Ask your doctor what over-the-counter pain medicines and prescription medicines you can start taking again. This is very important if you take blood thinners. Activity For at least 3 days, or for as long as told by your doctor, avoid: Douching. Using tampons. Having sex. Return to your normal activities as told by your doctor. Ask your doctor what activities are safe for you. General instructions Ask your doctor if you may take baths, swim, or use a hot tub. You may take showers. If you use birth control (contraception), keep using it. Keep all follow-up visits. Contact a doctor if: You have a fever or chills. You faint or feel light-headed. Get help right away if: You bleed a lot from your vagina. A lot of bleeding means that the bleeding soaks through a pad in less than 1 hour. You have clumps of blood (blood clots) coming from your vagina. You have signs that could mean you have an infection. This may be  fluid coming from your vagina that is: Different than normal. Yellow. Bad-smelling. You have very bad pain or cramps in your lower belly that do not get better with medicine. Summary If you did not have a sample of your tissue taken out, you may only have some spotting of blood for a few days. You can go back to your normal activities. If you had a sample of your tissue taken out, it is common to have mild pain for a few days and spotting for 48 hours. Avoid douching, using tampons, and having sex for at least 3 days after the procedure or for as long as told. Get help right away if you have a lot of bleeding, very bad pain, or signs of infection. This information is not intended to replace advice given to you by your health care provider. Make sure you discuss any questions you have with your health care provider. Document Revised: 10/19/2020 Document Reviewed: 10/19/2020 Elsevier Patient Education  2024 Elsevier Inc.  

## 2022-12-13 LAB — SURGICAL PATHOLOGY

## 2022-12-15 ENCOUNTER — Telehealth: Payer: Self-pay

## 2022-12-15 NOTE — Telephone Encounter (Addendum)
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  Her second biopsy was normal and the ECC was negative.  Needs repeat pap and HR HPV 1 year.  The biopsies were consistent with the pap smear finding at well.    Lum Keas, MD, covering for Dr. Briscoe Deutscher  Left message that I am calling with results and f/u please give the office a call back.   Leonette Nutting  12/15/22

## 2022-12-15 NOTE — Telephone Encounter (Signed)
Pt notified of results and to f/u in one year with pap smear with HPV testing.  Pt verbalized understanding.   Leonette Nutting  12/15/22

## 2022-12-16 ENCOUNTER — Encounter: Payer: Self-pay | Admitting: General Practice

## 2023-05-10 ENCOUNTER — Emergency Department (HOSPITAL_COMMUNITY): Payer: Medicaid Other

## 2023-05-10 ENCOUNTER — Other Ambulatory Visit: Payer: Self-pay

## 2023-05-10 ENCOUNTER — Emergency Department (HOSPITAL_COMMUNITY)
Admission: EM | Admit: 2023-05-10 | Discharge: 2023-05-10 | Disposition: A | Discharge status: Home / Self Care | Payer: Medicaid Other | Attending: Emergency Medicine | Admitting: Emergency Medicine

## 2023-05-10 ENCOUNTER — Encounter (HOSPITAL_COMMUNITY): Payer: Self-pay

## 2023-05-10 DIAGNOSIS — M79602 Pain in left arm: Secondary | ICD-10-CM | POA: Diagnosis present

## 2023-05-10 DIAGNOSIS — M75102 Unspecified rotator cuff tear or rupture of left shoulder, not specified as traumatic: Secondary | ICD-10-CM | POA: Diagnosis not present

## 2023-05-10 DIAGNOSIS — R0789 Other chest pain: Secondary | ICD-10-CM | POA: Diagnosis not present

## 2023-05-10 DIAGNOSIS — Z7984 Long term (current) use of oral hypoglycemic drugs: Secondary | ICD-10-CM | POA: Insufficient documentation

## 2023-05-10 MED ORDER — NAPROXEN 375 MG PO TABS: 375.0000 mg | ORAL_TABLET | Freq: Two times a day (BID) | ORAL | 0 refills | Status: DC

## 2023-05-10 MED ORDER — METHOCARBAMOL 500 MG PO TABS: 500.0000 mg | ORAL_TABLET | Freq: Two times a day (BID) | ORAL | 0 refills | Status: DC

## 2023-05-10 NOTE — ED Provider Notes (Signed)
EMERGENCY DEPARTMENT AT [facility] Provider Note   CSN: [id number] Arrival date & time: 05/10/23  3875     History  Chief Complaint  Patient presents with   Arm Pain    Melissa Ramirez is a 27 y.o. female.   Arm Pain   Patient has a history of asthma obesity polycystic ovarian syndrome who presents to the ED with complaints of shoulder pain.  Patient denies any recent injuries or falls.  She does not have any fevers or chills.  No coughing.  No shortness of breath.  Patient started noticing sharp pain in her left shoulder last evening.  It increased with movement.  Patient states the pain does radiate down her arm a bit.  She has also noticed she has pain in her left chest area with deep breathing.  She denies any history of DVT or PE.  She does not take any estrogen medications.    Home Medications Prior to Admission medications   Medication Sig Start Date End Date Taking? Authorizing Provider  methocarbamol (ROBAXIN) 500 MG tablet Take 1 tablet (500 mg total) by mouth 2 (two) times daily. 05/10/23  Yes Linwood Dibbles, MD  naproxen (NAPROSYN) 375 MG tablet Take 1 tablet (375 mg total) by mouth 2 (two) times daily. 05/10/23  Yes Linwood Dibbles, MD  benzonatate (TESSALON) 100 MG capsule Take 1 capsule (100 mg total) by mouth 3 (three) times daily. Patient not taking: Reported on 12/08/2022 02/04/21   Redwine, Madison A, PA-C  ferrous sulfate (FERROUSUL) 325 (65 FE) MG tablet Take 1 tablet (325 mg total) by mouth every other day. 09/10/21   Marny Lowenstein, PA-C  medroxyPROGESTERone (PROVERA) 10 MG tablet Take 1 tablet by mouth once daily Patient not taking: Reported on 12/08/2022 10/13/22   Federico Flake, MD  metFORMIN (GLUCOPHAGE) 500 MG tablet Take 1 tablet (500 mg total) by mouth 2 (two) times daily with a meal. Patient not taking: Reported on 11/15/2022 09/07/21   Marny Lowenstein, PA-C  norelgestromin-ethinyl estradiol [employer]) 150-35 MCG/24HR transdermal patch Place 1  patch onto the skin once a week. 11/15/22   Lorriane Shire, MD  polyethylene glycol (MIRALAX / GLYCOLAX) 17 g packet Take by mouth. 09/07/22   [provider]  albuterol (PROVENTIL HFA;VENTOLIN HFA) 108 (90 BASE) MCG/ACT inhaler Inhale 1-2 puffs into the lungs every 6 (six) hours as needed for wheezing or shortness of breath. Patient not taking: Reported on 11/22/2016 08/13/14 06/05/19  Marlon Pel, PA-C  cetirizine (ZYRTEC ALLERGY) 10 MG tablet Take 1 tablet (10 mg total) by mouth daily. Patient not taking: Reported on 11/22/2016 04/03/16 06/05/19  Everlene Farrier, PA-C      Allergies    Morphine and codeine, Morphine sulfate, and Oxycodone    Review of Systems   Review of Systems  Physical Exam Updated Vital Signs BP (!) 149/82 (BP Location: Left Arm)   Pulse 74   Temp 98.9 F (37.2 C) (Oral)   Resp 18   Ht 1.651 m (5\' 5" )   Wt (!) 136.8 kg   LMP 05/04/2023 (Approximate)   SpO2 100%   BMI 50.19 kg/m  Physical Exam Vitals and nursing note reviewed.  Constitutional:      General: She is not in acute distress.    Appearance: She is well-developed.  HENT:     Head: Normocephalic and atraumatic.     Right Ear: External ear normal.     Left Ear: External ear normal.  Eyes:  General: No scleral icterus.       Right eye: No discharge.        Left eye: No discharge.     Conjunctiva/sclera: Conjunctivae normal.  Neck:     Trachea: No tracheal deviation.  Cardiovascular:     Rate and Rhythm: Normal rate.  Pulmonary:     Effort: Pulmonary effort is normal. No respiratory distress.     Breath sounds: Normal breath sounds. No stridor.  Chest:     Chest wall: Tenderness present.  Abdominal:     General: There is no distension.  Musculoskeletal:        General: No swelling or deformity.     Cervical back: Neck supple.     Comments: Tenderness to palpation left periscapular region, pain with range of motion left shoulder, no effusions noted, no swelling no  deformity  Skin:    General: Skin is warm and dry.     Findings: No rash.  Neurological:     Mental Status: She is alert. Mental status is at baseline.     Cranial Nerves: No dysarthria or facial asymmetry.     Motor: No seizure activity.     ED Results / Procedures / Treatments   Labs (all labs ordered are listed, but only abnormal results are displayed) Labs Reviewed - No data to display  EKG None  Radiology DG Chest 2 View  Result Date: 05/10/2023 CLINICAL DATA:  pain. EXAM: CHEST - 2 VIEW COMPARISON:  10/31/2020. FINDINGS: Bilateral lung fields are clear. Bilateral costophrenic angles are clear. Normal cardio-mediastinal silhouette. No acute osseous abnormalities. The soft tissues are within normal limits. IMPRESSION: *No active cardiopulmonary disease. Electronically Signed   By: Jules Schick M.D.   On: 05/10/2023 11:39   DG Shoulder Left  Result Date: 05/10/2023 CLINICAL DATA:  Pain EXAM: LEFT SHOULDER - 2+ VIEW COMPARISON:  None Available. FINDINGS: No acute fracture or dislocation. No aggressive osseous lesion. Glenohumeral and acromioclavicular joints are normal in alignment. No significant arthritis. No soft tissue swelling. No radiopaque foreign bodies. IMPRESSION: *No acute osseous abnormality of the left shoulder joint. Electronically Signed   By: Jules Schick M.D.   On: 05/10/2023 11:35    Procedures Procedures    Medications Ordered in ED Medications - No data to display  ED Course/ Medical Decision Making/ A&P Clinical Course as of 05/10/23 1207  Tue May 10, 2023  1147 Chest and shoulder x-rays without acute findings. [JK]    Clinical Course User Index [JK] Linwood Dibbles, MD                             Louie Casa (Revised) Score: 0, Geneva Score Interpretation: Low Risk Group: 7-9% incidence of pulmonary embolism from several studies PERC Score: 0, PERC Score Interpretation: No need for further workup, as <2% chance of PE.  If no criteria are positive and  clinicians pre-test probability is <15%, PERC Rule criteria are satisfied Medical Decision Making Problems Addressed: Rotator cuff syndrome, left: acute illness or injury that poses a threat to life or bodily functions  Amount and/or Complexity of Data Reviewed Radiology: ordered and independent interpretation performed.  Risk Prescription drug management.   Patient presented to ED with complaints of left shoulder pain and pleuritic type chest pain.  Chest x-ray without evidence of pneumonia or pneumothorax.  Patient low risk for PE.  PERC negative.  Doubt embolism.  Patient does have muscular tenderness in the periscapular  region.  Suspect symptoms related to rotator cuff inflammation.  Discharge home with anti-inflammatory medications muscle relaxants.        Final Clinical Impression(s) / ED Diagnoses Final diagnoses:  Rotator cuff syndrome, left    Rx / DC Orders ED Discharge Orders          Ordered    naproxen (NAPROSYN) 375 MG tablet  2 times daily        05/10/23 1206    methocarbamol (ROBAXIN) 500 MG tablet  2 times daily        05/10/23 1206              Linwood Dibbles, MD 05/10/23 1207

## 2023-05-10 NOTE — Discharge Instructions (Signed)
Take the medications as prescribed to help with your pain and discomfort.  Follow-up with a primary care doctor to be rechecked if the symptoms do not resolve in the next week.  Return to the ED for fevers shortness of breath or other concerning symptoms

## 2023-05-10 NOTE — ED Triage Notes (Signed)
Pt arrived reporting yesterday evening while cooking had sharp pain 8/10 to L arm, pain shoots down arm. Endorses pain hurts more with inspiration. States she took ibuprofen, no relief. Denies cardiac hx, denies chest pain, shob, recent travels.

## 2023-05-25 ENCOUNTER — Other Ambulatory Visit: Payer: Self-pay | Admitting: Family Medicine

## 2023-05-25 DIAGNOSIS — E282 Polycystic ovarian syndrome: Secondary | ICD-10-CM

## 2023-06-02 ENCOUNTER — Telehealth: Payer: Self-pay | Admitting: Lactation Services

## 2023-06-02 NOTE — Telephone Encounter (Signed)
Received refill request for Provera. Patient seen in the office on 11/15/22 and was started on birth control patches. There was no mention of continuing Provera.   Called patient to assess for abnormal bleeding and need for Provera. She did not answer. LM for her to check her Mychart message or return call to office.   Attempted to call pharmacy to cancel Rx refill request until determination of need is noted. Pharmacy closed at this time.

## 2023-06-03 ENCOUNTER — Other Ambulatory Visit: Payer: Self-pay | Admitting: Lactation Services

## 2023-06-03 DIAGNOSIS — E282 Polycystic ovarian syndrome: Secondary | ICD-10-CM

## 2023-06-03 MED ORDER — MEDROXYPROGESTERONE ACETATE 10 MG PO TABS: 10.0000 mg | ORAL_TABLET | Freq: Every day | ORAL | 2 refills | Status: DC

## 2023-06-03 NOTE — Progress Notes (Signed)
Patient requested refill of Provera for abnormal bleeding. Ok to refill per Dr. Briscoe Deutscher. See mychart message thread for more information.

## 2023-07-04 ENCOUNTER — Other Ambulatory Visit: Payer: Self-pay

## 2023-07-04 ENCOUNTER — Encounter: Payer: Self-pay | Admitting: Obstetrics and Gynecology

## 2023-07-04 ENCOUNTER — Ambulatory Visit: Payer: Medicaid Other | Admitting: Obstetrics and Gynecology

## 2023-07-04 VITALS — BP 119/80 | HR 84 | Wt 289.4 lb

## 2023-07-04 DIAGNOSIS — E282 Polycystic ovarian syndrome: Secondary | ICD-10-CM | POA: Diagnosis not present

## 2023-07-04 MED ORDER — TRANEXAMIC ACID 650 MG PO TABS: 1300.0000 mg | ORAL_TABLET | Freq: Three times a day (TID) | ORAL | 2 refills | Status: DC

## 2023-07-04 NOTE — Progress Notes (Signed)
    GYNECOLOGY VISIT  Patient name: Melissa Ramirez MRN [id number]  Date of birth: [birth year] Chief Complaint:   Menstrual Problem  History:  Melissa Ramirez is a 28 y.o. G0P0000 being seen today for AUB follow up.  Used patch x 2 months and then stopped for unclear reasons - helped a little but still abnormal bleeding - felt as though her menses were longer and not improved with the patch and therefore requested provera to stop. Notes no prior oral medication that seemed to help; had improvement with depo but experienced weigh gain. Expressed hesitancy with use of IUD to control menses and would like to think on it further. Notes that she doesn't do well with taking pills daily.                                            Past Medical History:  Diagnosis Date   Anxiety    Asthma    Obesity    PCOS (polycystic ovarian syndrome)     Past Surgical History:  Procedure Laterality Date   FOOT SURGERY  2009   TONSILLECTOMY     tubes in ears      The following portions of the patient's history were reviewed and updated as appropriate: allergies, current medications, past family history, past medical history, past social history, past surgical history and problem list.   Health Maintenance:   Last pap     Component Value Date/Time   DIAGPAP (A) 11/15/2022 1558    - Atypical squamous cells of undetermined significance (ASC-US)   HPVHIGH Positive (A) 11/15/2022 1558   ADEQPAP  11/15/2022 1558    Satisfactory for evaluation; transformation zone component PRESENT.   Colpo (12/2022) CIN 1  Last mammogram: N/A   Review of Systems:  Pertinent items are noted in HPI. Comprehensive review of systems was otherwise negative.   Objective:  Physical Exam BP 119/80   Pulse 84   Wt 289 lb 6.4 oz (131.3 kg)   BMI 48.16 kg/m    Physical Exam Vitals and nursing note reviewed.  Constitutional:      Appearance: Normal appearance.  HENT:     Head: Normocephalic and atraumatic.  Pulmonary:      Effort: Pulmonary effort is normal.  Neurological:     General: No focal deficit present.     Mental Status: She is alert.  Psychiatric:        Mood and Affect: Mood normal.        Behavior: Behavior normal.        Thought Content: Thought content normal.        Judgment: Judgment normal.       Assessment & Plan:   1. PCOS (polycystic ovarian syndrome) (Primary) Reports no improvement with CHC patch - will trial TXA during menses and will consider IUD further if no improvement with TXA. Follow up pap due 12/2023. Noted that no procedure would help to maintain a think endometrial lining and that endometrial ablation is incompatible with future fertility. Thickened EMS often due to annovulatory cycles and treatment to keep lining thin is with hormonal intervention (pills, patch, IUD, etc) to achieve regular withdrawal bleed or therapeutic amenorrhea.    Routine preventative health maintenance measures emphasized.  Lorriane Shire, MD Minimally Invasive Gynecologic Surgery Center for [facility], [facility]

## 2023-07-29 ENCOUNTER — Encounter (HOSPITAL_BASED_OUTPATIENT_CLINIC_OR_DEPARTMENT_OTHER): Payer: Self-pay | Admitting: Emergency Medicine

## 2023-07-29 ENCOUNTER — Emergency Department (HOSPITAL_BASED_OUTPATIENT_CLINIC_OR_DEPARTMENT_OTHER)
Admission: EM | Admit: 2023-07-29 | Discharge: 2023-07-29 | Disposition: A | Discharge status: Home / Self Care | Payer: Medicaid Other | Attending: Emergency Medicine | Admitting: Emergency Medicine

## 2023-07-29 ENCOUNTER — Other Ambulatory Visit: Payer: Self-pay

## 2023-07-29 DIAGNOSIS — J069 Acute upper respiratory infection, unspecified: Secondary | ICD-10-CM | POA: Diagnosis not present

## 2023-07-29 DIAGNOSIS — B349 Viral infection, unspecified: Secondary | ICD-10-CM | POA: Diagnosis not present

## 2023-07-29 DIAGNOSIS — R059 Cough, unspecified: Secondary | ICD-10-CM | POA: Diagnosis present

## 2023-07-29 LAB — RESP PANEL BY RT-PCR (RSV, FLU A&B, COVID)  RVPGX2
Influenza A by PCR: NEGATIVE
Influenza B by PCR: NEGATIVE
Resp Syncytial Virus by PCR: NEGATIVE
SARS Coronavirus 2 by RT PCR: NEGATIVE

## 2023-07-29 NOTE — ED Notes (Signed)
 Discharge instructions and follow up care reviewed and explained, pt verbalized understanding and had no further questions on d/c. Pt caox4, ambulatory, NAD on d/c.

## 2023-07-29 NOTE — ED Provider Notes (Signed)
[facility] EMERGENCY DEPARTMENT AT [facility] Provider Note   CSN: [id number] Arrival date & time: 07/29/23  1048     History  Chief Complaint  Patient presents with   Sore Throat   URI    Melissa Ramirez is a 28 y.o. female.  Otherwise healthy 28 year old female who is here today for 2 days of sore throat, congestion, cough developed last evening.  While her family members have been sick recently.   Sore Throat  URI      Home Medications Prior to Admission medications   Medication Sig Start Date End Date Taking? Authorizing Provider  benzonatate (TESSALON) 100 MG capsule Take 1 capsule (100 mg total) by mouth 3 (three) times daily. Patient not taking: Reported on 07/04/2023 02/04/21   Redwine, Madison A, PA-C  ferrous sulfate (FERROUSUL) 325 (65 FE) MG tablet Take 1 tablet (325 mg total) by mouth every other day. 09/10/21   Marny Lowenstein, PA-C  medroxyPROGESTERone (PROVERA) 10 MG tablet Take 1 tablet (10 mg total) by mouth daily. Patient not taking: Reported on 07/04/2023 06/03/23   Lorriane Shire, MD  metFORMIN (GLUCOPHAGE) 500 MG tablet Take 1 tablet (500 mg total) by mouth 2 (two) times daily with a meal. Patient not taking: Reported on 07/04/2023 09/07/21   Marny Lowenstein, PA-C  methocarbamol (ROBAXIN) 500 MG tablet Take 1 tablet (500 mg total) by mouth 2 (two) times daily. Patient not taking: Reported on 07/04/2023 05/10/23   Linwood Dibbles, MD  naproxen (NAPROSYN) 375 MG tablet Take 1 tablet (375 mg total) by mouth 2 (two) times daily. Patient not taking: Reported on 07/04/2023 05/10/23   Linwood Dibbles, MD  polyethylene glycol (MIRALAX / GLYCOLAX) 17 g packet Take by mouth. Patient not taking: Reported on 07/04/2023 09/07/22   [provider]  tranexamic acid (LYSTEDA) 650 MG TABS tablet Take 2 tablets (1,300 mg total) by mouth 3 (three) times daily. Take during menses for a maximum of five days 07/04/23   Lorriane Shire, MD  albuterol (PROVENTIL HFA;VENTOLIN  HFA) 108 (90 BASE) MCG/ACT inhaler Inhale 1-2 puffs into the lungs every 6 (six) hours as needed for wheezing or shortness of breath. Patient not taking: Reported on 11/22/2016 08/13/14 06/05/19  Marlon Pel, PA-C  cetirizine (ZYRTEC ALLERGY) 10 MG tablet Take 1 tablet (10 mg total) by mouth daily. Patient not taking: Reported on 11/22/2016 04/03/16 06/05/19  Everlene Farrier, PA-C      Allergies    Morphine and codeine, Morphine sulfate, and Oxycodone    Review of Systems   Review of Systems  Physical Exam Updated Vital Signs BP 123/69 (BP Location: Left Arm)   Pulse 73   Temp 98.4 F (36.9 C) (Oral)   Resp 18   Ht 5\' 5"  (1.651 m)   Wt 131.3 kg   LMP 06/17/2023 (Approximate)   SpO2 100%   BMI 48.17 kg/m  Physical Exam Vitals reviewed.  Constitutional:      Appearance: She is not ill-appearing.  HENT:     Nose: Congestion present.     Mouth/Throat:     Mouth: Mucous membranes are moist. No oral lesions.     Pharynx: No oropharyngeal exudate or uvula swelling.     Tonsils: No tonsillar exudate.  Cardiovascular:     Heart sounds: Normal heart sounds.  Pulmonary:     Effort: Pulmonary effort is normal. No respiratory distress.     Breath sounds: No wheezing or rales.  Musculoskeletal:     Cervical back:  Normal range of motion.     ED Results / Procedures / Treatments   Labs (all labs ordered are listed, but only abnormal results are displayed) Labs Reviewed  RESP PANEL BY RT-PCR (RSV, FLU A&B, COVID)  [id number]    EKG None  Radiology No results found.  Procedures Procedures    Medications Ordered in ED Medications - No data to display  ED Course/ Medical Decision Making/ A&P                                 Medical Decision Making 28 year old female who is here today for URI symptoms.  Plan-patient with a URI.  Symptoms are ongoing for 2 days.  Normal vital signs, reassuring physical exam.  Viral swabs negative.  With symptoms, multiple family members  with the same, believe this is viral syndrome.  Discharge.           Final Clinical Impression(s) / ED Diagnoses Final diagnoses:  Viral syndrome  Upper respiratory tract infection, unspecified type    Rx / DC Orders ED Discharge Orders     None         Anders Simmonds T, DO 07/29/23 1215

## 2023-07-29 NOTE — ED Triage Notes (Signed)
Pt via pov from home with sore throat, nasal congestion, some cough x 2 days. Pt reports productive cough, no fever. A&O x 4; nad noted.

## 2024-01-12 ENCOUNTER — Ambulatory Visit: Admitting: Obstetrics and Gynecology

## 2024-01-12 ENCOUNTER — Other Ambulatory Visit (HOSPITAL_COMMUNITY)
Admission: RE | Admit: 2024-01-12 | Discharge: 2024-01-12 | Disposition: A | Discharge status: Home / Self Care | Source: Ambulatory Visit | Attending: Obstetrics and Gynecology | Admitting: Obstetrics and Gynecology

## 2024-01-12 ENCOUNTER — Encounter: Payer: Self-pay | Admitting: Obstetrics and Gynecology

## 2024-01-12 ENCOUNTER — Other Ambulatory Visit: Payer: Self-pay

## 2024-01-12 VITALS — BP 135/86 | HR 84 | Ht 66.0 in | Wt 305.1 lb

## 2024-01-12 DIAGNOSIS — E282 Polycystic ovarian syndrome: Secondary | ICD-10-CM | POA: Diagnosis present

## 2024-01-12 DIAGNOSIS — Z862 Personal history of diseases of the blood and blood-forming organs and certain disorders involving the immune mechanism: Secondary | ICD-10-CM | POA: Diagnosis not present

## 2024-01-12 DIAGNOSIS — Z124 Encounter for screening for malignant neoplasm of cervix: Secondary | ICD-10-CM | POA: Insufficient documentation

## 2024-01-12 DIAGNOSIS — Z6841 Body Mass Index (BMI) 40.0 and over, adult: Secondary | ICD-10-CM

## 2024-01-12 DIAGNOSIS — N870 Mild cervical dysplasia: Secondary | ICD-10-CM

## 2024-01-12 DIAGNOSIS — Z113 Encounter for screening for infections with a predominantly sexual mode of transmission: Secondary | ICD-10-CM

## 2024-01-12 DIAGNOSIS — N926 Irregular menstruation, unspecified: Secondary | ICD-10-CM

## 2024-01-12 NOTE — Patient Instructions (Signed)
  Inositols for PCOS: It is suggested that the inositols myo-inositol and D-chiro-inositol can reduce insulin resistance, improve ovarian function, and reduce androgen levels in women with PCOS.Clinical evidence has demonstrated that the 40 : 1 ratio between myo-inositol and D-chiro-inositol is the optimal combination to restore ovulation in PCOS women. The following brands have a 40:1 ratio: 1) Ovasitol by theralogix: https://theralogix.com/products/ovasitol-inositol-powder 2) Ovacure by med-vial: https://med-vial.com/products/ovacure/

## 2024-01-12 NOTE — Addendum Note (Signed)
 Addended by: Pepper Wyndham on: 01/12/2024 01:35 PM   Modules accepted: Orders

## 2024-01-12 NOTE — Progress Notes (Signed)
 Obstetrics and Gynecology New Patient Evaluation  Appointment Date: 01/12/2024  OBGYN Clinic: [facility]   Primary Care Provider: Medicine, [facility]  Chief Complaint: follow up PCOS  History of Present Illness: Melissa Ramirez is a 28 y.o.  [id number] (Patient's last menstrual period was 11/06/2023 (approximate).), seen for the above chief complaint. Her past medical history is significant for BMI 40s, PCOS, pre-diabetes, h/o anemia, CIN 1  Patient with long standing history of irregular periods. She states that she's been on depo, patch, pills and patch. Collectively, she states she's been on some type of hormone for several years with depo being the longest, but she hasn't been on something for quite a while. Her periods continue to only be about twice a year with last period in June and last for a few weeks.  2024 u/s was negative except for PCO with ES of 12.64mm. Patient has h/o metformin  but stopped due to side effects  Review of Systems: Pertinent items are noted in HPI.   Patient Active Problem List   Diagnosis Date Noted   BMI 45.0-49.9, adult (HCC) 01/12/2024   Irregular periods 11/15/2022   Eczema 11/15/2022   Allergic rhinitis 11/15/2022   Acanthosis nigricans 11/15/2022   Anemia 09/10/2021   History of syphilis 09/10/2021   Symptomatic anemia 09/13/2019   Obesity 02/21/2014   PCOS (polycystic ovarian syndrome) 02/21/2014   Asthma 02/21/2014   Fibrocystic breast changes of both breasts 02/21/2014   Past Medical History:  Past Medical History:  Diagnosis Date   Anxiety    Asthma    Obesity    PCOS (polycystic ovarian syndrome)    Past Surgical History:  Past Surgical History:  Procedure Laterality Date   FOOT SURGERY  2009   TONSILLECTOMY     tubes in ears     Past Obstetrical History:  OB History  Gravida Para Term Preterm AB Living  0 0 0 0 0 0  SAB IAB Ectopic Multiple Live Births  0 0 0 0    Past  Gynecological History: As per HPI.  Social History:  Social History   Socioeconomic History   Marital status: Single    Spouse name: Not on file   Number of children: Not on file   Years of education: Not on file   Highest education level: Not on file  Occupational History   Not on file  Tobacco Use   Smoking status: Never   Smokeless tobacco: Never  Vaping Use   Vaping status: Never Used  Substance and Sexual Activity   Alcohol use: No   Drug use: No   Sexual activity: Yes    Birth control/protection: None  Other Topics Concern   Not on file  Social History Narrative   Not on file   Social Drivers of Health   Financial Resource Strain: Not at Risk (09/07/2022)   Received from [employer]    Financial Resource Strain: 1  Food Insecurity: At Risk (09/07/2022)   Received from [facility] Insecurity    Food: 2  Transportation Needs: Not at Risk (09/07/2022)   Received from [employer] Needs    Transportation: 1  Physical Activity: Not on File (09/24/2021)   Received from [facility]   Physical Activity    Physical Activity: 0  Stress: Not on File (09/24/2021)   Received from [facility]   Stress    Stress: 0  Social Connections: Not on  File (02/14/2023)   Received from [facility]   Social Connections    Connectedness: 0  Intimate Partner Violence: Not on file   Family History:  Family History  Problem Relation Age of Onset   Hypertension Other    Diabetes Other     Medications Lovena D. Rusconi had no medications administered during this visit. Current Outpatient Medications  Medication Sig Dispense Refill   benzonatate  (TESSALON ) 100 MG capsule Take 1 capsule (100 mg total) by mouth 3 (three) times daily. (Patient not taking: Reported on 01/12/2024) 21 capsule 0   ferrous sulfate  (FERROUSUL) 325 (65 FE) MG tablet Take 1 tablet (325 mg total) by mouth every other day. (Patient not taking: Reported on 01/12/2024) 15 tablet 11   methocarbamol  (ROBAXIN )  500 MG tablet Take 1 tablet (500 mg total) by mouth 2 (two) times daily. (Patient not taking: Reported on 01/12/2024) 20 tablet 0   naproxen  (NAPROSYN ) 375 MG tablet Take 1 tablet (375 mg total) by mouth 2 (two) times daily. (Patient not taking: Reported on 01/12/2024) 20 tablet 0   polyethylene glycol (MIRALAX / GLYCOLAX) 17 g packet Take by mouth. (Patient not taking: Reported on 01/12/2024)     No current facility-administered medications for this visit.   Allergies Morphine and codeine, Morphine sulfate, and Oxycodone  Physical Exam:  BP 135/86   Pulse 84   Ht 5' 6 (1.676 m)   Wt (!) 305 lb 1.6 oz (138.4 kg)   LMP 11/06/2023 (Approximate)   BMI 49.24 kg/m  Body mass index is 49.24 kg/m.  General appearance: Well nourished, well developed female in no acute distress.  Respiratory: Normal respiratory effort Abdomen: obese, nttp, nd Neuro/Psych:  Normal mood and affect.  Skin:  Warm and dry.  Lymphatic:  No inguinal lymphadenopathy.   Cervical exam performed in the presence of a chaperone Pelvic exam: is limited by body habitus EGBUS: within normal limits Vagina: within normal limits Cervix: normal appearing cervix without tenderness, discharge or lesions. Uterus:  nonenlarged and non tender Adnexa:  normal adnexa and no mass, fullness, tenderness Rectovaginal: deferred  Laboratory: none  Radiology: no new imaging  Assessment: patient stabe  Plan:  1. Screen for STD (sexually transmitted disease) (Primary) - Cytology - PAP( [place]) - RPR+HBsAg+HCVAb+...  2. PCOS (polycystic ovarian syndrome) I don't see prior labs and recommend this.  Long d/w pt re: PCOS and metabolic risks and as well as GYN problems. D/w her re: need for endometrial suppression or regular periods to decrease hyperplasia risk as well as hopefully lessen period severity. I told her I'd recommend a mirena but cyclic provera  would be fine too if she's okay with taking qmonth. Pt consider options and  let us  know - Cytology - PAP( [place]) - RPR+HBsAg+HCVAb+... - TSH+Prl+TestT+TestF+17OHP - DHEA-sulfate - Hemoglobin A1c - Beta hCG quant (ref lab) - Lipid panel  3. Dysplasia of cervix, low grade (CIN 1) Repeat pap today  4. History of anemia - Anemia Profile B  5. BMI 45.0-49.9, adult (HCC) Weight has been stable for a year. Pt states she sees a nutritionist  6. Irregular periods See above.   Orders Placed This Encounter  Procedures   RPR+HBsAg+HCVAb+...   TSH+Prl+TestT+TestF+17OHP   DHEA-sulfate   Hemoglobin A1c   Beta hCG quant (ref lab)   Anemia Profile B   Lipid panel   Return in about 3 months (around 04/13/2024) for in person.  No future appointments.  Bebe Izell Raddle MD Attending Center for [employer] (  Chiropractor)

## 2024-01-12 NOTE — Progress Notes (Signed)
 Patient here for annual exam.   Concerns:  Heavy and irregular periods.  Birth control. Possibly Nexplanon or IUD  Std screening  Cervical cancer screening

## 2024-01-16 ENCOUNTER — Other Ambulatory Visit

## 2024-01-16 ENCOUNTER — Other Ambulatory Visit: Payer: Self-pay

## 2024-01-16 DIAGNOSIS — Z113 Encounter for screening for infections with a predominantly sexual mode of transmission: Secondary | ICD-10-CM

## 2024-01-16 DIAGNOSIS — Z862 Personal history of diseases of the blood and blood-forming organs and certain disorders involving the immune mechanism: Secondary | ICD-10-CM

## 2024-01-16 DIAGNOSIS — Z6841 Body Mass Index (BMI) 40.0 and over, adult: Secondary | ICD-10-CM

## 2024-01-16 DIAGNOSIS — E282 Polycystic ovarian syndrome: Secondary | ICD-10-CM

## 2024-01-18 LAB — CYTOLOGY - PAP
Chlamydia: NEGATIVE
Comment: NEGATIVE
Comment: NEGATIVE
Comment: NORMAL
Neisseria Gonorrhea: NEGATIVE
Trichomonas: NEGATIVE

## 2024-01-20 LAB — ANEMIA PROFILE B
Basophils Absolute: 0 x10E3/uL (ref 0.0–0.2)
Basos: 0 %
EOS (ABSOLUTE): 0.2 x10E3/uL (ref 0.0–0.4)
Eos: 2 %
Ferritin: 12 ng/mL — ABNORMAL LOW (ref 15–150)
Folate: 7.9 ng/mL (ref >3.0)
Hematocrit: 30.6 % — ABNORMAL LOW (ref 34.0–46.6)
Hemoglobin: 8.2 g/dL — ABNORMAL LOW (ref 11.1–15.9)
Immature Grans (Abs): 0.1 x10E3/uL (ref 0.0–0.1)
Immature Granulocytes: 1 %
Iron Saturation: 11 % — ABNORMAL LOW (ref 15–55)
Iron: 51 ug/dL (ref 27–159)
Lymphocytes Absolute: 1.7 x10E3/uL (ref 0.7–3.1)
Lymphs: 22 %
MCH: 17.3 pg — ABNORMAL LOW (ref 26.6–33.0)
MCHC: 26.8 g/dL — ABNORMAL LOW (ref 31.5–35.7)
MCV: 65 fL — ABNORMAL LOW (ref 79–97)
Monocytes Absolute: 0.3 x10E3/uL (ref 0.1–0.9)
Monocytes: 4 %
Neutrophils Absolute: 5.6 x10E3/uL (ref 1.4–7.0)
Neutrophils: 71 %
Platelets: 343 x10E3/uL (ref 150–450)
RBC: 4.73 x10E6/uL (ref 3.77–5.28)
RDW: 18.5 % — ABNORMAL HIGH (ref 11.7–15.4)
Retic Ct Pct: 1.8 % (ref 0.6–2.6)
Total Iron Binding Capacity: 480 ug/dL — ABNORMAL HIGH (ref 250–450)
UIBC: 429 ug/dL — ABNORMAL HIGH (ref 131–425)
Vitamin B-12: 482 pg/mL (ref 232–1245)
WBC: 7.8 x10E3/uL (ref 3.4–10.8)

## 2024-01-20 LAB — TSH+PRL+TESTT+TESTF+17OHP
17-Hydroxyprogesterone: 34 ng/dL
Prolactin: 8.6 ng/mL (ref 4.8–33.4)
TSH: 1.03 u[IU]/mL (ref 0.450–4.500)
Testosterone, Free: 1.1 pg/mL (ref 0.0–4.2)
Testosterone, Total, LC/MS: 45.9 ng/dL (ref 10.0–55.0)

## 2024-01-20 LAB — RPR, QUANT+TP ABS (REFLEX)
Rapid Plasma Reagin, Quant: 1:1 {titer} — ABNORMAL HIGH
T Pallidum Abs: NONREACTIVE

## 2024-01-20 LAB — LIPID PANEL
Chol/HDL Ratio: 4.8 ratio — ABNORMAL HIGH (ref 0.0–4.4)
Cholesterol, Total: 183 mg/dL (ref 100–199)
HDL: 38 mg/dL — ABNORMAL LOW (ref >39)
LDL Chol Calc (NIH): 123 mg/dL — ABNORMAL HIGH (ref 0–99)
Triglycerides: 123 mg/dL (ref 0–149)
VLDL Cholesterol Cal: 22 mg/dL (ref 5–40)

## 2024-01-20 LAB — HEMOGLOBIN A1C
Est. average glucose Bld gHb Est-mCnc: 108 mg/dL
Hgb A1c MFr Bld: 5.4 % (ref 4.8–5.6)

## 2024-01-20 LAB — DHEA-SULFATE: DHEA-SO4: 121 ug/dL (ref 84.8–378.0)

## 2024-01-20 LAB — RPR+HBSAG+HCVAB+...
HIV Screen 4th Generation wRfx: NONREACTIVE
Hep C Virus Ab: NONREACTIVE
Hepatitis B Surface Ag: NEGATIVE
RPR Ser Ql: REACTIVE — AB

## 2024-01-20 LAB — VITAMIN D 25 HYDROXY (VIT D DEFICIENCY, FRACTURES): Vit D, 25-Hydroxy: 6.3 ng/mL — ABNORMAL LOW (ref 30.0–100.0)

## 2024-01-20 LAB — BETA HCG QUANT (REF LAB): hCG Quant: <1 m[IU]/mL

## 2024-01-23 ENCOUNTER — Ambulatory Visit: Payer: Self-pay | Admitting: Obstetrics and Gynecology

## 2024-01-23 ENCOUNTER — Encounter: Payer: Self-pay | Admitting: Obstetrics and Gynecology

## 2024-01-23 DIAGNOSIS — E786 Lipoprotein deficiency: Secondary | ICD-10-CM

## 2024-01-23 DIAGNOSIS — E7800 Pure hypercholesterolemia, unspecified: Secondary | ICD-10-CM

## 2024-01-23 DIAGNOSIS — R87612 Low grade squamous intraepithelial lesion on cytologic smear of cervix (LGSIL): Secondary | ICD-10-CM | POA: Insufficient documentation

## 2024-01-23 DIAGNOSIS — D500 Iron deficiency anemia secondary to blood loss (chronic): Secondary | ICD-10-CM

## 2024-01-23 DIAGNOSIS — R7989 Other specified abnormal findings of blood chemistry: Secondary | ICD-10-CM

## 2024-01-25 ENCOUNTER — Telehealth: Payer: Self-pay | Admitting: Family Medicine

## 2024-01-25 DIAGNOSIS — R7989 Other specified abnormal findings of blood chemistry: Secondary | ICD-10-CM | POA: Insufficient documentation

## 2024-01-25 DIAGNOSIS — E7800 Pure hypercholesterolemia, unspecified: Secondary | ICD-10-CM | POA: Insufficient documentation

## 2024-01-25 DIAGNOSIS — E786 Lipoprotein deficiency: Secondary | ICD-10-CM | POA: Insufficient documentation

## 2024-01-25 MED ORDER — VITAMIN D (ERGOCALCIFEROL) 1.25 MG (50000 UNIT) PO CAPS: 50000.0000 [IU] | ORAL_CAPSULE | ORAL | 0 refills | Status: AC

## 2024-01-25 NOTE — Telephone Encounter (Signed)
 Called patient today to inform her that her coloscopy appointment has been scheduled for 03/09/24 at 8:35 am. Patient did not answer the phone so I left a detailed voice message of her appointment date and time and asked her to call us  back if she needs to change her appointment.

## 2024-01-30 ENCOUNTER — Telehealth: Payer: Self-pay

## 2024-01-30 NOTE — Telephone Encounter (Signed)
 Left message for patient advising of appointment on 8/26

## 2024-01-31 ENCOUNTER — Inpatient Hospital Stay

## 2024-01-31 ENCOUNTER — Inpatient Hospital Stay: Attending: Hematology and Oncology | Admitting: Hematology and Oncology

## 2024-01-31 ENCOUNTER — Encounter: Payer: Self-pay | Admitting: Hematology and Oncology

## 2024-01-31 VITALS — BP 150/84 | HR 79 | Temp 98.1°F | Resp 20 | Wt 305.7 lb

## 2024-01-31 DIAGNOSIS — N921 Excessive and frequent menstruation with irregular cycle: Secondary | ICD-10-CM | POA: Diagnosis not present

## 2024-01-31 DIAGNOSIS — D509 Iron deficiency anemia, unspecified: Secondary | ICD-10-CM | POA: Insufficient documentation

## 2024-01-31 DIAGNOSIS — D500 Iron deficiency anemia secondary to blood loss (chronic): Secondary | ICD-10-CM | POA: Insufficient documentation

## 2024-01-31 NOTE — Progress Notes (Signed)
 [facility] CONSULT NOTE  Patient Care Team: Medicine, [facility] as PCP - General (Family Medicine)  CHIEF COMPLAINTS/PURPOSE OF CONSULTATION:  IDA  ASSESSMENT & PLAN:   Assessment and Plan Assessment & Plan Iron  deficiency anemia secondary to menorrhagia with irregular menses Chronic iron  deficiency anemia due to prolonged and heavy menstrual bleeding. Hemoglobin at 8.2 g/dL indicates significant anemia. Symptoms include fatigue, low mood, and ice craving. Previous oral iron  poorly tolerated. Menstrual cycles irregular, contributing to ongoing anemia. B12 and folic acid  levels normal,  Anemia developed over time, no severe symptoms like syncope or dyspnea. - Administer IV iron  infusion ferraheme. Monitor for allergic reactions. - Coordinate with gynecologist for menstrual irregularities. Consider hormonal treatment like Mirena or pill. - Schedule follow-up in eight weeks to reassess hemoglobin. Further infusions may be needed in 2-3 months if irregularities persist.  HISTORY OF PRESENTING ILLNESS:  Melissa Ramirez 28 y.o. female is here because of IDA  Discussed the use of AI scribe software for clinical note transcription with the patient, who gave verbal consent to proceed.  History of Present Illness Melissa Ramirez is a 28 year old female with iron  deficiency anemia who presents with fatigue and irregular heavy menstrual bleeding. She is accompanied by her mother. She was referred by her gynecologist for evaluation of anemia.  She experiences significant fatigue and low energy levels, which she attributes to her anemia. 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 Fatigue is particularly pronounced during work hours.  Her menstrual cycles are irregular, occurring approximately once every three months and lasting for about a month. The bleeding is heavy, and she has never had regular periods. She has been  evaluated by a gynecologist and tried birth control without success. Other options discussed include the Mirena IUD and a pill to regulate her cycles.  She has been taking iron  supplements but experienced constipation, requiring a stool softener. Her hemoglobin was measured at 8.2 g/dL. She previously had a blood transfusion; at that time, her hemoglobin was 7.7 g/dL.  No shortness of breath or syncope, but she does experience cravings for ice, which is a symptom of iron  deficiency. She has no history of anxiety or major surgeries aside from a tonsillectomy and foot surgery.  Her current medications include an as-needed albuterol  inhaler and a recently prescribed vitamin D  supplement, which she has not yet started. She is not currently taking metformin .  All other systems were reviewed with the patient and are negative.  MEDICAL HISTORY:  Past Medical History:  Diagnosis Date   Anxiety    Asthma    Obesity    PCOS (polycystic ovarian syndrome)     SURGICAL HISTORY: Past Surgical History:  Procedure Laterality Date   FOOT SURGERY  2009   TONSILLECTOMY     tubes in ears      SOCIAL HISTORY: Social History   Socioeconomic History   Marital status: Single    Spouse name: Not on file   Number of children: Not on file   Years of education: Not on file   Highest education level: Not on file  Occupational History   Not on file  Tobacco Use   Smoking status: Never   Smokeless tobacco: Never  Vaping Use   Vaping status: Never Used  Substance and Sexual Activity   Alcohol use: No   Drug use: No   Sexual activity: Yes    Birth control/protection:  None  Other Topics Concern   Not on file  Social History Narrative   Not on file   Social Drivers of Health   Financial Resource Strain: Not at Risk (09/07/2022)   Received from [employer]    Financial Resource Strain: 1  Food Insecurity: At Risk (09/07/2022)   Received from [employer]     Food: 2  Transportation Needs: Not at Risk (09/07/2022)   Received from [employer] Needs    Transportation: 1  Physical Activity: Not on File (09/24/2021)   Received from [facility]   Physical Activity    Physical Activity: 0  Stress: Not on File (09/24/2021)   Received from [facility]   Stress    Stress: 0  Social Connections: Not on File (02/14/2023)   Received from [employer] [facility]   Social Connections    Connectedness: 0  Intimate Partner Violence: Not on file    FAMILY HISTORY: Family History  Problem Relation Age of Onset   Hypertension Other    Diabetes Other     ALLERGIES:  is allergic to morphine and codeine, morphine sulfate, and oxycodone.  MEDICATIONS:  Current Outpatient Medications  Medication Sig Dispense Refill   Vitamin D , Ergocalciferol , (DRISDOL ) 1.25 MG (50000 UNIT) CAPS capsule Take 1 capsule (50,000 Units total) by mouth every 7 (seven) days for 12 doses. 12 capsule 0   benzonatate  (TESSALON ) 100 MG capsule Take 1 capsule (100 mg total) by mouth 3 (three) times daily. (Patient not taking: Reported on 01/31/2024) 21 capsule 0   ferrous sulfate  (FERROUSUL) 325 (65 FE) MG tablet Take 1 tablet (325 mg total) by mouth every other day. (Patient not taking: Reported on 01/31/2024) 15 tablet 11   medroxyPROGESTERone  (PROVERA ) 10 MG tablet Take 1 tablet (10 mg total) by mouth daily. (Patient not taking: Reported on 01/31/2024) 10 tablet 2   metFORMIN  (GLUCOPHAGE ) 500 MG tablet Take 1 tablet (500 mg total) by mouth 2 (two) times daily with a meal. (Patient not taking: Reported on 01/31/2024) 60 tablet 5   methocarbamol  (ROBAXIN ) 500 MG tablet Take 1 tablet (500 mg total) by mouth 2 (two) times daily. (Patient not taking: Reported on 01/31/2024) 20 tablet 0   naproxen  (NAPROSYN ) 375 MG tablet Take 1 tablet (375 mg total) by mouth 2 (two) times daily. (Patient not taking: Reported on 01/31/2024) 20 tablet 0   polyethylene glycol (MIRALAX / GLYCOLAX) 17 g packet Take by mouth. (Patient not  taking: Reported on 01/31/2024)     tranexamic acid  (LYSTEDA ) 650 MG TABS tablet Take 2 tablets (1,300 mg total) by mouth 3 (three) times daily. Take during menses for a maximum of five days (Patient not taking: Reported on 01/31/2024) 30 tablet 2   No current facility-administered medications for this visit.     PHYSICAL EXAMINATION: ECOG PERFORMANCE STATUS: 0 - Asymptomatic  Vitals:   01/31/24 1417  BP: (!) 150/84  Pulse: 79  Resp: 20  Temp: 98.1 F (36.7 C)  SpO2: 100%   Filed Weights   01/31/24 1417  Weight: (!) 305 lb 11.2 oz (138.7 kg)    GENERAL:alert, no distress and comfortable SKIN: skin color, texture, turgor are normal, no rashes or significant lesions EYES: normal, conjunctiva are pink and non-injected, sclera clear OROPHARYNX:no exudate, no erythema and lips, buccal mucosa, and tongue normal  NECK: supple, thyroid normal size, non-tender, without nodularity LYMPH:  no palpable lymphadenopathy in the cervical, axillary  LUNGS: clear to auscultation and percussion  with normal breathing effort HEART: regular rate & rhythm and no murmurs and no lower extremity edema ABDOMEN:abdomen soft, non-tender and normal bowel sounds Musculoskeletal:no cyanosis of digits and no clubbing  PSYCH: alert & oriented x 3 with fluent speech NEURO: no focal motor/sensory deficits  LABORATORY DATA:  I have reviewed the data as listed Lab Results  Component Value Date   WBC 7.8 01/16/2024   HGB 8.2 (L) 01/16/2024   HCT 30.6 (L) 01/16/2024   MCV 65 (L) 01/16/2024   PLT 343 01/16/2024     Chemistry      Component Value Date/Time   NA 136 06/22/2022 1010   K 4.0 06/22/2022 1010   CL 105 06/22/2022 1010   CO2 21 (L) 06/22/2022 1010   BUN 14 06/22/2022 1010   CREATININE 0.80 06/22/2022 1010      Component Value Date/Time   CALCIUM 9.1 06/22/2022 1010   ALKPHOS 87 06/22/2022 1010   AST 23 06/22/2022 1010   ALT 25 06/22/2022 1010   BILITOT 0.5 06/22/2022 1010        RADIOGRAPHIC STUDIES: I have personally reviewed the radiological images as listed and agreed with the findings in the report. No results found.  All questions were answered. The patient knows to call the clinic with any problems, questions or concerns. I spent 45 minutes in the care of this patient including H and P, review of records, counseling and coordination of care.     Amber Stalls, MD 01/31/2024 2:54 PM

## 2024-02-03 ENCOUNTER — Telehealth: Payer: Self-pay

## 2024-02-03 ENCOUNTER — Other Ambulatory Visit: Payer: Self-pay | Admitting: Hematology and Oncology

## 2024-02-03 NOTE — Telephone Encounter (Signed)
 Dr. Loretha, patient will be scheduled as soon as possible.  Auth Submission: NO AUTH NEEDED Site of care: Site of care: [facility] Payer: [employer] Medicaid Medication & CPT/J Code(s) submitted: Feraheme (ferumoxytol) [id number] Diagnosis Code:  Route of submission (phone, fax, portal):  Phone # Fax # Auth type: Buy/Bill PB Units/visits requested: 510mg  x 2 doses Reference number:  Approval from: 02/03/24 to 06/04/24

## 2024-02-10 ENCOUNTER — Encounter: Payer: Self-pay | Admitting: Obstetrics and Gynecology

## 2024-02-10 DIAGNOSIS — R768 Other specified abnormal immunological findings in serum: Secondary | ICD-10-CM | POA: Insufficient documentation

## 2024-02-14 ENCOUNTER — Ambulatory Visit (INDEPENDENT_AMBULATORY_CARE_PROVIDER_SITE_OTHER)

## 2024-02-14 VITALS — BP 133/82 | HR 78 | Temp 98.9°F | Resp 16 | Ht 66.0 in | Wt 310.0 lb

## 2024-02-14 DIAGNOSIS — D509 Iron deficiency anemia, unspecified: Secondary | ICD-10-CM

## 2024-02-14 MED ORDER — SODIUM CHLORIDE 0.9 % IV SOLN
510.0000 mg | Freq: Once | INTRAVENOUS | Status: AC
  Administered 2024-02-14: 510 mg via INTRAVENOUS
  Filled 2024-02-14: qty 17

## 2024-02-14 NOTE — Progress Notes (Signed)
 Diagnosis: Iron  Deficiency Anemia  Provider:  Praveen Mannam MD  Procedure: IV Infusion  IV Type: Peripheral, IV Location: L Forearm  Feraheme (Ferumoxytol ), Dose: 510 mg  Infusion Start Time: 1140  Infusion Stop Time: 1156  Post Infusion IV Care: Observation period completed and Peripheral IV Discontinued  Discharge: Condition: Good, Destination: Home . AVS Declined  Performed by:  Mac Dowdell, RN

## 2024-02-21 ENCOUNTER — Ambulatory Visit (INDEPENDENT_AMBULATORY_CARE_PROVIDER_SITE_OTHER)

## 2024-02-21 VITALS — BP 116/84 | HR 84 | Temp 97.8°F | Resp 20 | Ht 66.0 in | Wt 308.8 lb

## 2024-02-21 DIAGNOSIS — D509 Iron deficiency anemia, unspecified: Secondary | ICD-10-CM

## 2024-02-21 MED ORDER — SODIUM CHLORIDE 0.9 % IV SOLN
510.0000 mg | Freq: Once | INTRAVENOUS | Status: AC
  Administered 2024-02-21: 510 mg via INTRAVENOUS
  Filled 2024-02-21: qty 17

## 2024-02-21 NOTE — Progress Notes (Signed)
 Diagnosis: Iron  Deficiency Anemia  Provider:  Mannam, Praveen MD  Procedure: IV Infusion  IV Type: Peripheral, IV Location: R Hand  Feraheme (Ferumoxytol ), Dose: 510 mg  Infusion Start Time: 1221  Infusion Stop Time: 1237  Post Infusion IV Care: Observation period completed and Peripheral IV Discontinued  Discharge: Condition: Good, Destination: Home . AVS Declined  Performed by:  Rocky FORBES Sar, RN

## 2024-03-09 ENCOUNTER — Ambulatory Visit: Admitting: Obstetrics and Gynecology

## 2024-04-09 ENCOUNTER — Other Ambulatory Visit: Payer: Self-pay | Admitting: Obstetrics and Gynecology

## 2024-04-09 DIAGNOSIS — E282 Polycystic ovarian syndrome: Secondary | ICD-10-CM

## 2024-04-30 ENCOUNTER — Ambulatory Visit: Payer: Self-pay | Admitting: Obstetrics and Gynecology

## 2024-04-30 ENCOUNTER — Encounter: Payer: Self-pay | Admitting: Obstetrics and Gynecology

## 2024-05-01 NOTE — Progress Notes (Signed)
 Patient did not keep her follow up GYN appointment for 04/30/2024.  Bebe Izell Raddle MD Attending Center for [employer] [facility] Midwife)

## 2024-07-16 ENCOUNTER — Ambulatory Visit: Admitting: Family
